# Patient Record
Sex: Female | Born: 1950 | Marital: Married | Smoking: Former smoker
Health system: Southern US, Community
[De-identification: ages and names within clinical notes are randomized; demographics above are authoritative.]

## PROBLEM LIST (undated history)

## (undated) DIAGNOSIS — K219 Gastro-esophageal reflux disease without esophagitis: Secondary | ICD-10-CM

## (undated) DIAGNOSIS — R9430 Abnormal result of cardiovascular function study, unspecified: Secondary | ICD-10-CM

## (undated) DIAGNOSIS — E785 Hyperlipidemia, unspecified: Secondary | ICD-10-CM

## (undated) DIAGNOSIS — K5790 Diverticulosis of intestine, part unspecified, without perforation or abscess without bleeding: Secondary | ICD-10-CM

## (undated) DIAGNOSIS — D649 Anemia, unspecified: Secondary | ICD-10-CM

## (undated) HISTORY — DX: Hyperlipidemia, unspecified: E78.5

## (undated) HISTORY — DX: Abnormal result of cardiovascular function study, unspecified: R94.30

## (undated) HISTORY — PX: APPENDECTOMY: SHX54

## (undated) HISTORY — DX: Anemia, unspecified: D64.9

## (undated) HISTORY — PX: EYE SURGERY: SHX253

## (undated) HISTORY — DX: Diverticulosis of intestine, part unspecified, without perforation or abscess without bleeding: K57.90

## (undated) HISTORY — DX: Gastro-esophageal reflux disease without esophagitis: K21.9

## (undated) HISTORY — PX: TUBAL LIGATION: SHX77

---

## 1999-08-13 ENCOUNTER — Other Ambulatory Visit: Admission: RE | Admit: 1999-08-13 | Discharge: 1999-08-13 | Payer: Self-pay | Admitting: Family Medicine

## 2003-02-10 ENCOUNTER — Other Ambulatory Visit: Admission: RE | Admit: 2003-02-10 | Discharge: 2003-02-10 | Payer: Self-pay | Admitting: Internal Medicine

## 2010-12-03 ENCOUNTER — Ambulatory Visit (HOSPITAL_COMMUNITY)
Admission: RE | Admit: 2010-12-03 | Discharge: 2010-12-03 | Disposition: A | Discharge status: Home / Self Care | Payer: 59 | Source: Ambulatory Visit | Attending: Obstetrics & Gynecology | Admitting: Obstetrics & Gynecology

## 2010-12-03 ENCOUNTER — Other Ambulatory Visit: Payer: Self-pay | Admitting: Obstetrics & Gynecology

## 2010-12-03 DIAGNOSIS — N950 Postmenopausal bleeding: Secondary | ICD-10-CM | POA: Insufficient documentation

## 2010-12-03 LAB — CBC
HCT: 41.3 % (ref 36.0–46.0)
Hemoglobin: 14.7 g/dL (ref 12.0–15.0)
MCH: 32.5 pg (ref 26.0–34.0)
MCHC: 35.6 g/dL (ref 30.0–36.0)
MCV: 91.4 fL (ref 78.0–100.0)
Platelets: 238 10*3/uL (ref 150–400)
RBC: 4.52 MIL/uL (ref 3.87–5.11)
RDW: 12.5 % (ref 11.5–15.5)
WBC: 7.5 10*3/uL (ref 4.0–10.5)

## 2010-12-03 LAB — TYPE AND SCREEN
ABO/RH(D): O POS
Antibody Screen: NEGATIVE

## 2010-12-03 LAB — ABO/RH: ABO/RH(D): O POS

## 2010-12-15 NOTE — Op Note (Signed)
  [name], Yvonne                ACCOUNT NO.:  [id number]  MEDICAL RECORD NO.:  [id number]           PATIENT TYPE:  O  LOCATION:  [facility]                          [facility]  PHYSICIAN:  Genia Del, M.D.DATE OF BIRTH:  [birth year]  DATE OF PROCEDURE:  12/03/2010 DATE OF DISCHARGE:                              OPERATIVE REPORT   PREOPERATIVE DIAGNOSES:  Postmenopausal bleeding and intrauterine lesion.  POSTOPERATIVE DIAGNOSES:  Postmenopausal bleeding and intrauterine lesions x2.  PROCEDURE:  Hysteroscopy resection, dilatation and curettage.  SURGEON:  Genia Del, MD  ANESTHESIOLOGIST:  Brayton Caves, MD  PROCEDURE:  Under general anesthesia with mask ventilation, the patient was in lithotomy position.  She was prepped with Betadine on the suprapubic vulvar and vaginal areas and draped as usual.  The vaginal exam reveals an anteverted uterus normal size.  No adnexal mass.  The speculum was introduced in the vagina.  The anterior lip of the cervix was grasped with a tenaculum.  A paracervical block was done with Nesacaine 1% at 20 mL total at 4 and 8 o'clock.  We then proceeded with dilatation of the cervix with Hegar dilators up to #31 without difficulty.  We used the operative hysteroscope with the Versapoint loop.  We visualized the intrauterine cavity.  Vision was exceptionally good.  We see both ostia and pictures were taken.  We also see a large intrauterine lesion with its base at the lower posterior aspect of the intrauterine cavity.  The lesion was measured by ultrasound at 4.3 cm. The aspect of the lesion was quite vascular and the lesion appears softer, it gives the appearance of a hyperplastic polyp.  We started the resection at the base with the loop, then lesion was completely detached.  Hemostasis was adequate at the base.  We then used a fenestrated clamp to remove the specimen.  We then go back with the hysteroscope and note that a second  intrauterine lesion with the same appearance was present and still attached to the left fundal wall.  We used the Versapoint loop to detach that second lesion from the base. Once it was freed, it was removed with the loop.  We finally go back with the hysteroscope in the intrauterine cavity.  No lesion was present.  Both bases of those intrauterine lesions were hemostatic.  We proceeded with an endometrial curettage with a sharp curette and all intrauterine surfaces that specimen was sent to pathology separately. We then go back confirmed good hemostasis, took pictures of the intrauterine cavity which appears normal.  We removed all instruments. The estimated blood loss was minimal.  Fluid deficit was 230 mL.  No complications occurred and the patient was brought to recovery room in good stable status.    Genia Del, M.D.    ML/MEDQ  D:  12/03/2010  T:  12/04/2010  Job:  [id number]  Electronically Signed by Genia Del M.D. on 12/15/2010 66:44:03 PM

## 2010-12-24 HISTORY — PX: COMBINED HYSTEROSCOPY DIAGNOSTIC / D&C: SUR297

## 2011-09-06 ENCOUNTER — Ambulatory Visit (INDEPENDENT_AMBULATORY_CARE_PROVIDER_SITE_OTHER): Payer: 59 | Admitting: Cardiology

## 2011-09-06 ENCOUNTER — Encounter: Payer: Self-pay | Admitting: *Deleted

## 2011-09-06 ENCOUNTER — Encounter: Payer: Self-pay | Admitting: Cardiology

## 2011-09-06 DIAGNOSIS — K219 Gastro-esophageal reflux disease without esophagitis: Secondary | ICD-10-CM | POA: Insufficient documentation

## 2011-09-06 DIAGNOSIS — R079 Chest pain, unspecified: Secondary | ICD-10-CM | POA: Insufficient documentation

## 2011-09-06 DIAGNOSIS — E785 Hyperlipidemia, unspecified: Secondary | ICD-10-CM | POA: Insufficient documentation

## 2011-09-06 DIAGNOSIS — R9430 Abnormal result of cardiovascular function study, unspecified: Secondary | ICD-10-CM | POA: Insufficient documentation

## 2011-09-06 NOTE — Patient Instructions (Signed)
Your physician has requested that you have en exercise stress myoview. For further information please visit [email or web address] Please follow instruction sheet, as given.  Your physician recommends that you schedule a follow-up appointment in: as needed

## 2011-09-06 NOTE — Assessment & Plan Note (Signed)
The patient has had some reflux symptoms over time.  However this does not appear to explain her recent chest discomfort.

## 2011-09-06 NOTE — Assessment & Plan Note (Signed)
The patient has had some mild chest discomfort.  At this point we do not have definite proof of ischemic pain.  However her treadmill was abnormal and she needs further workup.

## 2011-09-06 NOTE — Progress Notes (Signed)
HPI  The patient is seen in consultation today for a history of chest discomfort and for an abnormal treadmill.  She is quite active.  She has some left sternal chest discomfort.  It is not necessarily related to exertion.  She's had elevated triglycerides.  Her resting EKG reveals minor nonspecific ST-T wave changes.  Her primary physician arranged for a standard treadmill. This was performed by her primary team.  She exercised for 7 minutes.  Her blood pressure one appropriately.  She did not have any more chest pain.  However she had significant downsloping ST depression in leads 2,3, anf  aVF and V4 to V6 In recovery.  She was therefore referred for further evaluation.  As part of today's evaluation I have reviewed the treadmill report.  Also reviewed office note from her primary care team.  Allergies  Allergen Reactions  . Tdap (Adacel)   . Tetanus Antitoxin     Current Outpatient Prescriptions  Medication Sig Dispense Refill  . omeprazole (PRILOSEC) 40 MG capsule Take 40 mg by mouth daily.       Marland Kitchen ESTRACE VAGINAL 0.1 MG/GM vaginal cream Maybe once a week per pt        History   Social History  . Marital Status: Married    Spouse Name: N/A    Number of Children: N/A  . Years of Education: N/A   Occupational History  . Not on file.   Social History Main Topics  . Smoking status: Former Games developer  . Smokeless tobacco: Not on file   Comment: quit over 30 years ago  . Alcohol Use: Yes     1-2 daily  . Drug Use: No  . Sexually Active: Not on file   Other Topics Concern  . Not on file   Social History Narrative  . No narrative on file    Family History  Problem Relation Age of Onset  . Diabetes Sister   . Hypertension Mother     possible  . Nephrolithiasis Mother     Past Medical History  Diagnosis Date  . Hyperlipidemia   . Chest pain   . GERD (gastroesophageal reflux disease)   . Abnormal exercise tolerance test     Past Surgical History  Procedure Date  .  Combined hysteroscopy diagnostic / d&c 12/24/2010    ROS   Patient denies fever, chills, headache, sweats, rash, change in vision, change in hearing, cough, nausea vomiting, urinary symptoms.  All of the systems are reviewed and are negative.  PHYSICAL EXAM  Patient is oriented to person time and place.  Affect is normal.  Head is atraumatic.  There is no jugular venous distention.  Lungs are clear.  Respiratory effort is not labored.  Cardiac exam reveals S1 and S2.  No clicks or significant murmurs.  The abdomen is soft there is no peripheral edema.  There no musculoskeletal deformities.  No skin rashes.  Filed Vitals:   09/06/11 1108  BP: 123/75  Pulse: 66  Height: 5' (1.524 m)  Weight: 142 lb (64.411 kg)    EKG  EKG is done today and reviewed by me.  There is normal sinus rhythm.  There are mild nonspecific ST-T wave changes.  ASSESSMENT & PLAN

## 2011-09-06 NOTE — Assessment & Plan Note (Addendum)
The patient had downsloping ST depression from her standard treadmill.  It is possible that this could have been a false positive.  However she has had chest pain.  We will proceed with a stress nuclear scan or further assessment. If the patient's nuclear scan is normal, we will communicate this to her as she would not need a followup visit.  I explained this to her and she understands.  If there is any abnormality I'll see her for followup

## 2011-09-15 ENCOUNTER — Ambulatory Visit (HOSPITAL_COMMUNITY): Payer: 59 | Attending: Cardiovascular Disease | Admitting: Radiology

## 2011-09-15 DIAGNOSIS — Z87891 Personal history of nicotine dependence: Secondary | ICD-10-CM | POA: Insufficient documentation

## 2011-09-15 DIAGNOSIS — E785 Hyperlipidemia, unspecified: Secondary | ICD-10-CM | POA: Insufficient documentation

## 2011-09-15 DIAGNOSIS — R079 Chest pain, unspecified: Secondary | ICD-10-CM

## 2011-09-15 DIAGNOSIS — R9439 Abnormal result of other cardiovascular function study: Secondary | ICD-10-CM | POA: Insufficient documentation

## 2011-09-15 DIAGNOSIS — R9430 Abnormal result of cardiovascular function study, unspecified: Secondary | ICD-10-CM

## 2011-09-15 MED ORDER — TECHNETIUM TC 99M TETROFOSMIN IV KIT
10.0000 | PACK | Freq: Once | INTRAVENOUS | Status: AC | PRN
  Administered 2011-09-15: 10 via INTRAVENOUS

## 2011-09-15 MED ORDER — TECHNETIUM TC 99M TETROFOSMIN IV KIT
30.0000 | PACK | Freq: Once | INTRAVENOUS | Status: AC | PRN
  Administered 2011-09-15: 30 via INTRAVENOUS

## 2011-09-15 NOTE — Progress Notes (Signed)
[facility] SITE 3 NUCLEAR MED [address] [place] [address] [phone]  Cardiology Nuclear Med Study  Yvonne [name] is a 60 y.o. female [id number] [birth year]   Nuclear Med Background Indication for Stress Test:  Evaluation for Ischemia and Abnormal GXT History: 12/12 [id number] Cardiac Risk Factors: History of Smoking and Lipids  Symptoms:  Chest Pressure.  (last episode of chest discomfort was about one month ago)   Nuclear Pre-Procedure Caffeine/Decaff Intake:  None NPO After: 7:00pm   Lungs:  Clear. IV 0.9% NS with Angio Cath:  22g  IV Site: R Antecubital  IV Started by:  Stanton Kidney, EMT-P  Chest Size (in):  36 Cup Size: B  Height: 5' (1.524 m)  Weight:  142 lb (64.411 kg)  BMI:  Body mass index is 27.73 kg/(m^2). Tech Comments:  NA    Nuclear Med Study 1 or 2 day study: 1 day  Stress Test Type:  Stress  Reading MD: Charlton Haws, MD  Order Authorizing Provider:  Willa Rough, MD  Resting Radionuclide: Technetium 24m Tetrofosmin  Resting Radionuclide Dose: 11.0 mCi   Stress Radionuclide:  Technetium 3m Tetrofosmin  Stress Radionuclide Dose: 33.0 mCi           Stress Protocol Rest HR: 61 Stress HR: 176  Rest BP: Sitting:127/72  Standing:140/75 Stress BP: 164/83  Exercise Time (min): 8:15 METS: 10.1   Predicted Max HR: 159 bpm % Max HR: 110.69 bpm Rate Pressure Product: 25366   Dose of Adenosine (mg):  n/a Dose of Lexiscan: n/a mg  Dose of Atropine (mg): n/a Dose of Dobutamine: n/a mcg/kg/min (at max HR)  Stress Test Technologist: Smiley Houseman, CMA-N  Nuclear Technologist:  Domenic Polite, CNMT     Rest Procedure:  Myocardial perfusion imaging was performed at rest 45 minutes following the intravenous administration of Technetium 36m Tetrofosmin.  Rest ECG: No acute changes.  Stress Procedure:  The patient exercised for 8:15 on the treadmill utilizing the Bruce protocol.  The patient stopped due to fatigue and denied any chest  pain.  There were diagnostic ST-T wave changes and occasional PAC's.  Technetium 88m Tetrofosmin was injected at peak exercise and myocardial perfusion imaging was performed after a brief delay.  Stress ECG: 2mm of ST segment depression in lateral leads with T wave inversions in recovery  QPS Raw Data Images:  Normal; no motion artifact; normal heart/lung ratio. Stress Images:  Normal homogeneous uptake in all areas of the myocardium. Rest Images:  Normal homogeneous uptake in all areas of the myocardium. Subtraction (SDS):  Normal Transient Ischemic Dilatation (Normal <1.22):  0.96 Lung/Heart Ratio (Normal <0.45):  0.21  Quantitative Gated Spect Images QGS EDV:  43 ml QGS ESV:  9 ml QGS cine images:  NL LV Function; NL Wall Motion QGS EF: 80%  Impression Exercise Capacity:  Fair exercise capacity. BP Response:  Normal blood pressure response. Clinical Symptoms:  There is dyspnea. ECG Impression:  Positive Comparison with Prior Nuclear Study: No previous nuclear study performed  Overall Impression:  Low risk stress nuclear study.  Normal scintigraphic images EF 80%  Positive ECG response see above    Charlton Haws

## 2011-09-22 ENCOUNTER — Encounter: Payer: Self-pay | Admitting: Cardiology

## 2011-09-22 NOTE — Progress Notes (Signed)
The patient had been seen in consultation because of an abnormal standard exercise test. Decision was made to do a nuclear stress study. This was done, December, 2012. Once again with exercise she had abnormal EKGs. However the nuclear images are completely normal and the ejection fraction is normal greater than 70%. Therefore results are communicated to the patient and she does not need any followup as outlined in my original consultation note.

## 2011-09-23 ENCOUNTER — Telehealth: Payer: Self-pay | Admitting: Cardiology

## 2011-09-23 NOTE — Telephone Encounter (Signed)
Fu call °Pt returning your call about lab results °

## 2011-09-23 NOTE — Telephone Encounter (Signed)
Results given to pt

## 2012-12-20 ENCOUNTER — Ambulatory Visit (INDEPENDENT_AMBULATORY_CARE_PROVIDER_SITE_OTHER): Payer: 59 | Admitting: Nurse Practitioner

## 2012-12-20 VITALS — BP 128/70 | HR 76 | Temp 97.8°F | Wt 144.0 lb

## 2012-12-20 DIAGNOSIS — A499 Bacterial infection, unspecified: Secondary | ICD-10-CM

## 2012-12-20 DIAGNOSIS — N390 Urinary tract infection, site not specified: Secondary | ICD-10-CM

## 2012-12-20 LAB — POCT URINALYSIS DIPSTICK
Bilirubin, UA: NEGATIVE
Blood, UA: NEGATIVE
Glucose, UA: NEGATIVE
Ketones, UA: NEGATIVE
Nitrite, UA: NEGATIVE
Protein, UA: NEGATIVE
Spec Grav, UA: 1.01
Urobilinogen, UA: NEGATIVE
pH, UA: 7.5

## 2012-12-20 LAB — POCT UA - MICROSCOPIC ONLY
Bacteria, U Microscopic: NEGATIVE
Casts, Ur, LPF, POC: NEGATIVE
Crystals, Ur, HPF, POC: NEGATIVE
Mucus, UA: NEGATIVE
RBC, urine, microscopic: NEGATIVE
Yeast, UA: NEGATIVE

## 2012-12-20 MED ORDER — CIPROFLOXACIN HCL 500 MG PO TABS: 500.0000 mg | ORAL_TABLET | Freq: Two times a day (BID) | ORAL | Status: AC

## 2012-12-20 NOTE — Progress Notes (Signed)
  Subjective:    Patient ID: Yvonne [name], female    DOB: Apr 13, 1951, 62 y.o.   MRN: [id number]  Urinary Tract Infection  This is a new problem. The current episode started yesterday. The problem occurs intermittently. The problem has been gradually worsening. The quality of the pain is described as aching and burning. The pain is at a severity of 8/10. The pain is moderate. There has been no fever. She is sexually active. There is no history of pyelonephritis. Pertinent negatives include no chills or flank pain. She has tried nothing for the symptoms.  Reports burning during urination, denies blood, or foul smelling urine. Also reports voiding more frequently in small amounts.     Review of Systems  Constitutional: Negative for fever, chills and appetite change.  Respiratory: Negative for chest tightness and shortness of breath.   Gastrointestinal: Negative for abdominal pain.  Genitourinary: Positive for dysuria. Negative for flank pain, difficulty urinating and pelvic pain.  Skin: Negative for rash.  Neurological: Negative for dizziness.  Psychiatric/Behavioral: Negative.        Objective:   Physical Exam  Constitutional: She is oriented to person, place, and time. She appears well-developed and well-nourished.  Cardiovascular: Normal rate, regular rhythm and normal heart sounds.   No murmur heard. Pulmonary/Chest: Effort normal and breath sounds normal. No respiratory distress.  Abdominal: Soft. Bowel sounds are normal. She exhibits no distension. There is no tenderness.  Neurological: She is alert and oriented to person, place, and time.  Skin: Skin is warm and dry.    Results for orders placed in visit on 12/20/12  POCT URINALYSIS DIPSTICK      Result Value Range   Color, UA yellow     Clarity, UA clear     Glucose, UA neg     Bilirubin, UA neg     Ketones, UA neg     Spec Grav, UA 1.010     Blood, UA neg     pH, UA 7.5     Protein, UA neg     Urobilinogen, UA  negative     Nitrite, UA neg     Leukocytes, UA moderate (2+)    POCT UA - MICROSCOPIC ONLY      Result Value Range   WBC, Ur, HPF, POC 15-20 with clumps     RBC, urine, microscopic neg     Bacteria, U Microscopic neg     Mucus, UA neg     Epithelial cells, urine per micros occ     Crystals, Ur, HPF, POC neg     Casts, Ur, LPF, POC neg     Yeast, UA neg          Assessment & Plan:  UTI Complete full course antibiotics Increase fluid intake Proper perineal hygiene Proper handwashing Increase fluid intake Frequent voiding RTO prn   Raymon Mutton, FNP-C

## 2012-12-20 NOTE — Patient Instructions (Signed)
Urinary Tract Infection Urinary tract infections (UTIs) can develop anywhere along your urinary tract. Your urinary tract is your body's drainage system for removing wastes and extra water. Your urinary tract includes two kidneys, two ureters, a bladder, and a urethra. Your kidneys are a pair of bean-shaped organs. Each kidney is about the size of your fist. They are located below your ribs, one on each side of your spine. CAUSES Infections are caused by microbes, which are microscopic organisms, including fungi, viruses, and bacteria. These organisms are so small that they can only be seen through a microscope. Bacteria are the microbes that most commonly cause UTIs. SYMPTOMS  Symptoms of UTIs may vary by age and gender of the patient and by the location of the infection. Symptoms in young women typically include a frequent and intense urge to urinate and a painful, burning feeling in the bladder or urethra during urination. Older women and men are more likely to be tired, shaky, and weak and have muscle aches and abdominal pain. A fever may mean the infection is in your kidneys. Other symptoms of a kidney infection include pain in your back or sides below the ribs, nausea, and vomiting. DIAGNOSIS To diagnose a UTI, your caregiver will ask you about your symptoms. Your caregiver also will ask to provide a urine sample. The urine sample will be tested for bacteria and white blood cells. White blood cells are made by your body to help fight infection. TREATMENT  Typically, UTIs can be treated with medication. Because most UTIs are caused by a bacterial infection, they usually can be treated with the use of antibiotics. The choice of antibiotic and length of treatment depend on your symptoms and the type of bacteria causing your infection. HOME CARE INSTRUCTIONS  If you were prescribed antibiotics, take them exactly as your caregiver instructs you. Finish the medication even if you feel better after you  have only taken some of the medication.  Drink enough water and fluids to keep your urine clear or pale yellow.  Avoid caffeine, tea, and carbonated beverages. They tend to irritate your bladder.  Empty your bladder often. Avoid holding urine for long periods of time.  Empty your bladder before and after sexual intercourse.  After a bowel movement, women should cleanse from front to back. Use each tissue only once. SEEK MEDICAL CARE IF:   You have back pain.  You develop a fever.  Your symptoms do not begin to resolve within 3 days. SEEK IMMEDIATE MEDICAL CARE IF:   You have severe back pain or lower abdominal pain.  You develop chills.  You have nausea or vomiting.  You have continued burning or discomfort with urination. MAKE SURE YOU:   Understand these instructions.  Will watch your condition.  Will get help right away if you are not doing well or get worse. Document Released: 06/29/2005 Document Revised: 03/20/2012 Document Reviewed: 10/28/2011 Exit[employer] Patient Information 2013 [employer].  

## 2013-01-21 ENCOUNTER — Telehealth: Payer: Self-pay | Admitting: Nurse Practitioner

## 2013-01-22 NOTE — Telephone Encounter (Signed)
appt made

## 2013-01-23 ENCOUNTER — Ambulatory Visit (INDEPENDENT_AMBULATORY_CARE_PROVIDER_SITE_OTHER): Payer: 59

## 2013-01-23 ENCOUNTER — Ambulatory Visit (INDEPENDENT_AMBULATORY_CARE_PROVIDER_SITE_OTHER): Payer: 59 | Admitting: Family Medicine

## 2013-01-23 ENCOUNTER — Encounter: Payer: Self-pay | Admitting: Family Medicine

## 2013-01-23 VITALS — BP 118/72 | HR 64 | Temp 97.0°F | Ht 60.0 in | Wt 143.0 lb

## 2013-01-23 DIAGNOSIS — J302 Other seasonal allergic rhinitis: Secondary | ICD-10-CM | POA: Insufficient documentation

## 2013-01-23 DIAGNOSIS — R5381 Other malaise: Secondary | ICD-10-CM

## 2013-01-23 DIAGNOSIS — R5383 Other fatigue: Secondary | ICD-10-CM

## 2013-01-23 DIAGNOSIS — R0989 Other specified symptoms and signs involving the circulatory and respiratory systems: Secondary | ICD-10-CM

## 2013-01-23 DIAGNOSIS — R0609 Other forms of dyspnea: Secondary | ICD-10-CM

## 2013-01-23 DIAGNOSIS — J309 Allergic rhinitis, unspecified: Secondary | ICD-10-CM

## 2013-01-23 DIAGNOSIS — R0600 Dyspnea, unspecified: Secondary | ICD-10-CM | POA: Insufficient documentation

## 2013-01-23 LAB — COMPLETE METABOLIC PANEL WITH GFR
ALT: 19 U/L (ref 0–35)
AST: 19 U/L (ref 0–37)
Albumin: 4.2 g/dL (ref 3.5–5.2)
Alkaline Phosphatase: 87 U/L (ref 39–117)
BUN: 14 mg/dL (ref 6–23)
CO2: 28 mEq/L (ref 19–32)
Calcium: 9.2 mg/dL (ref 8.4–10.5)
Chloride: 106 mEq/L (ref 96–112)
Creat: 0.7 mg/dL (ref 0.50–1.10)
GFR, Est African American: >89 mL/min
GFR, Est Non African American: >89 mL/min
Glucose, Bld: 92 mg/dL (ref 70–99)
Potassium: 4.3 mEq/L (ref 3.5–5.3)
Sodium: 141 mEq/L (ref 135–145)
Total Bilirubin: 0.7 mg/dL (ref 0.3–1.2)
Total Protein: 6.9 g/dL (ref 6.0–8.3)

## 2013-01-23 LAB — POCT CBC
Granulocyte percent: 54.6 %G (ref 37–80)
HCT, POC: 39.2 % (ref 37.7–47.9)
Hemoglobin: 13.5 g/dL (ref 12.2–16.2)
Lymph, poc: 2.3 (ref 0.6–3.4)
MCH, POC: 31.7 pg — AB (ref 27–31.2)
MCHC: 34.5 g/dL (ref 31.8–35.4)
MCV: 92 fL (ref 80–97)
MPV: 7.9 fL (ref 0–99.8)
POC Granulocyte: 3.1 (ref 2–6.9)
POC LYMPH PERCENT: 41.9 %L (ref 10–50)
Platelet Count, POC: 213 10*3/uL (ref 142–424)
RBC: 4.3 M/uL (ref 4.04–5.48)
RDW, POC: 13.1 %
WBC: 5.6 10*3/uL (ref 4.6–10.2)

## 2013-01-23 LAB — FOLATE: Folate: 14.8 ng/mL

## 2013-01-23 LAB — TSH: TSH: 2.525 u[IU]/mL (ref 0.350–4.500)

## 2013-01-23 LAB — VITAMIN B12: Vitamin B-12: 233 pg/mL (ref 211–911)

## 2013-01-23 MED ORDER — FLUTICASONE PROPIONATE 50 MCG/ACT NA SUSP: 2.0000 | Freq: Every day | NASAL | Status: DC

## 2013-01-23 MED ORDER — DESLORATADINE 5 MG PO TABS: 5.0000 mg | ORAL_TABLET | Freq: Every day | ORAL | Status: DC

## 2013-01-23 NOTE — Progress Notes (Signed)
Patient ID: Yvonne [name], female   DOB: Apr 06, 1951, 62 y.o.   MRN: [id number] SUBJECTIVE: HPI: Fatigue Patient complains of fatigue. Symptoms began about a month ago. The patient feels the fatigue began with: exercise intolerance. Symptoms of her fatigue have been general malaise. Patient describes the following psychological symptoms: none. Patient denies change in hair texture, cold intolerance, constipation, excessive menstrual bleeding, fever, GI blood loss, significant change in weight, symptoms of arthritis, unusual rashes and witnessed or suspected sleep apnea. Symptoms have been intermittent. Symptom severity: mild. Previous visits for this problem: none.   PMH/PSH: reviewed/updated in Epic  SH/FH: reviewed/updated in Epic  Allergies: reviewed/updated in Epic  Medications: reviewed/updated in Epic  Immunizations: reviewed/updated in Epic  ROS: As above in the HPI. All other systems are stable or negative.  OBJECTIVE: APPEARANCE: short stature Hispanic( [place] heritage) Patient in no acute distress.The patient appeared well nourished and normally developed. Acyanotic. Waist: VITAL SIGNS:BP 118/72  Pulse 64  Temp(Src) 97 F (36.1 C) (Oral)  Ht 5' (1.524 m)  Wt 143 lb (64.864 kg)  BMI 27.93 kg/m2  SpO2 98%   SKIN: warm and  Dry without overt rashes, tattoos and scars  HEAD and Neck: without JVD, Head and scalp: normal Eyes:No scleral icterus. Fundi normal, eye movements normal. Ears: Auricle normal, canal normal, Tympanic membranes normal, insufflation normal. Nose: normal Throat: normal Neck & thyroid: normal  CHEST & LUNGS: Chest wall: normal Lungs: Clear  CVS: Reveals the PMI to be normally located. Regular rhythm, First and Second Heart sounds are normal,  absence of murmurs, rubs or gallops. Peripheral vasculature: Radial pulses: normal Dorsal pedis pulses: normal Posterior pulses: normal  ABDOMEN:  Appearance: normal Benign,, no organomegaly, no  masses, no Abdominal Aortic enlargement. No Guarding , no rebound. No Bruits. Bowel sounds: normal  RECTAL: N/A GU: N/A  EXTREMETIES: nonedematous. Both Femoral and Pedal pulses are normal.  MUSCULOSKELETAL:  Spine: normal Joints: intact  NEUROLOGIC: oriented to time,place and person; nonfocal. Strength is normal Sensory is normal Reflexes are normal Cranial Nerves are normal.  ASSESSMENT: Other malaise and fatigue - Plan: POCT CBC, COMPLETE METABOLIC PANEL WITH GFR, TSH, Vitamin D 25 hydroxy, Vitamin B12, Folate, DG Chest 2 View, Ambulatory referral to Cardiology, CANCELED: POCT urinalysis dipstick  Dyspnea - Plan: POCT CBC, COMPLETE METABOLIC PANEL WITH GFR, DG Chest 2 View, EKG 12-Lead, Ambulatory referral to Cardiology  Seasonal allergic rhinitis - Plan: desloratadine (CLARINEX) 5 MG tablet, fluticasone (FLONASE) 50 MCG/ACT nasal spray  PLAN: Orders Placed This Encounter  Procedures  . DG Chest 2 View    Standing Status: Future     Number of Occurrences: 1     Standing Expiration Date: 03/25/2014    Order Specific Question:  Reason for Exam (SYMPTOM  OR DIAGNOSIS REQUIRED)    Answer:  dyspnea    Order Specific Question:  Reason for Exam (SYMPTOM  OR DIAGNOSIS REQUIRED)    Answer:  patient from [place]    Order Specific Question:  Preferred imaging location?    Answer:  Internal  . COMPLETE METABOLIC PANEL WITH GFR  . TSH  . Vitamin D 25 hydroxy  . Vitamin B12  . Folate  . Ambulatory referral to Cardiology    Referral Priority:  Routine    Referral Type:  Consultation    Referral Reason:  Specialty Services Required    Requested Specialty:  Cardiology    Number of Visits Requested:  1  . POCT CBC  . EKG 12-Lead  Results for orders placed in visit on 01/23/13 (from the past 24 hour(s))  POCT CBC     Status: Abnormal   Collection Time    01/23/13 10:45 AM      Result Value Range   WBC 5.6  4.6 - 10.2 K/uL   Lymph, poc 2.3  0.6 - 3.4   POC LYMPH PERCENT  41.9  10 - 50 %L   POC Granulocyte 3.1  2 - 6.9   Granulocyte percent 54.6  37 - 80 %G   RBC 4.3  4.04 - 5.48 M/uL   Hemoglobin 13.5  12.2 - 16.2 g/dL   HCT, POC 56.2  13.0 - 47.9 %   MCV 92.0  80 - 97 fL   MCH, POC 31.7 (*) 27 - 31.2 pg   MCHC 34.5  31.8 - 35.4 g/dL   RDW, POC 86.5     Platelet Count, POC 213.0  142 - 424 K/uL   MPV 7.9  0 - 99.8 fL   Meds ordered this encounter  Medications  . desloratadine (CLARINEX) 5 MG tablet    Sig: Take 1 tablet (5 mg total) by mouth daily.    Dispense:  30 tablet    Refill:  5  . fluticasone (FLONASE) 50 MCG/ACT nasal spray    Sig: Place 2 sprays into the nose daily.    Dispense:  16 g    Refill:  5  [facility] reading (PRIMARY) by  Dr. Leodis Sias: No acute findings.                           await the lab results. Await the  Evaluation by her Cardiologist.Referral made. Patient has her PEX scheduled in July.  Phelicia Dantes P. Modesto Charon, M.D.  .

## 2013-01-23 NOTE — Patient Instructions (Addendum)
Rinitis Alrgica (Allergic Rhinitis) La rinitis alrgica aparece cuando las membranas mucosas de la nariz reaccionan a los alrgenos. Los alrgenos son las partculas que estn en el aire y a las que el organismo responde cuando existe una Automotive engineer. Esto hace que usted libere anticuerpos de Programmer, multimedia. A travs de una sucesin de procesos, finalmente se libera histamina (de ah el uso de antihistamnicos) en el torrente sanguneo. Aunque esto implica una proteccin para su organismo, es lo que le produce las Thorp., como estornudos frecuentes, congestin, picazn y goteos de Architectural technologist.  CAUSAS Los alergenos del polen pueden provenir del csped, rboles y hierbas. Esto produce la rinitis alrgica estacional, o "fiebre de heno". Otras alrgenos pueden ocasionar rinitis alrgica persistente (rinitis alrgica perenne) como aquellos que contienen los caros del polvo del hogar, el pelaje de las mascotas y las esporas del moho.  SNTOMAS  Congestin nasal.  Picazn y goteo de la nariz con estornudos y lagrimeo de los ojos.  Generalmente, tambin puede haber picazn de la boca, ojos y odos. Las alergias no pueden curarse pero pueden controlarse con medicamentos. DIAGNSTICO Si no reconoce exactamente cul es el alrgeno que le ocasiona el problema, podrn realizarle pruebas de [place], o de piel para determinarlo. TRATAMIENTO  Evite el alrgeno.  Podrn ser tiles medicamentos y vacunas para la alergia (inmunoterapia).  Con frecuencia la fiebre de heno se trata simplemente con antihistamnicos en forma de pldoras o sprays nasales. Los antihistamnicos bloquean los efectos de la histamina. Existen medicamentos de venta libre que lo ayudarn a Associate Professor, la congestin nasal y la hinchazn alrededor de los ojos. Consulte con el profesional antes de tomar o Civil Service fast streamer. Si estos medicamentos no le Merchant navy officer, existen muchos otros nuevos que el profesional que  lo asiste puede prescribirle. Si las medidas iniciales no son efectivas, podrn utilizarse medicamentos ms fuertes. Las inyecciones desensibilizantes pueden utilizarse si los otros medicamentos fracasan. La desensibilizacin aparece cuando un paciente recibe inyecciones continuas hasta que el cuerpo se vuelve menos sensible al alrgeno. Asegrese de Education officer, environmental un seguimiento con el profesional que lo asiste si los problemas continan. SOLICITE ANTENCIN MDICA SI:   Le sube la temperatura a ms de 100.5 F (38.1 C).  Presenta tos que no se alivia (persistente).  Le falta el aire.  Comienza a respirar con dificultad.  Los sntomas interfieren con las actividades diarias. Document Released: 06/29/2005 Document Revised: 12/12/2011 [facility] Patient Information 2013 [place], [place].   Allergic Rhinitis Allergic rhinitis is when the mucous membranes in the nose respond to allergens. Allergens are particles in the air that cause your body to have an allergic reaction. This causes you to release allergic antibodies. Through a chain of events, these eventually cause you to release histamine into the blood stream (hence the use of antihistamines). Although meant to be protective to the body, it is this release that causes your discomfort, such as frequent sneezing, congestion and an itchy runny nose.  CAUSES  The pollen allergens may come from grasses, trees, and weeds. This is seasonal allergic rhinitis, or "hay fever." Other allergens cause year-round allergic rhinitis (perennial allergic rhinitis) such as house dust mite allergen, pet dander and mold spores.  SYMPTOMS   Nasal stuffiness (congestion).  Runny, itchy nose with sneezing and tearing of the eyes.  There is often an itching of the mouth, eyes and ears. It cannot be cured, but it can be controlled with medications. DIAGNOSIS  If you are unable to determine the offending allergen,  skin or blood testing may find it. TREATMENT   Avoid  the allergen.  Medications and allergy shots (immunotherapy) can help.  Hay fever may often be treated with antihistamines in pill or nasal spray forms. Antihistamines block the effects of histamine. There are over-the-counter medicines that may help with nasal congestion and swelling around the eyes. Check with your caregiver before taking or giving this medicine. If the treatment above does not work, there are many new medications your caregiver can prescribe. Stronger medications may be used if initial measures are ineffective. Desensitizing injections can be used if medications and avoidance fails. Desensitization is when a patient is given ongoing shots until the body becomes less sensitive to the allergen. Make sure you follow up with your caregiver if problems continue. SEEK MEDICAL CARE IF:   You develop fever (more than 100.5 F (38.1 C).  You develop a cough that does not stop easily (persistent).  You have shortness of breath.  You start wheezing.  Symptoms interfere with normal daily activities. Document Released: 06/14/2001 Document Revised: 12/12/2011 Document Reviewed: 12/24/2008 [facility] Patient Information 2013 [place], [place].

## 2013-01-24 LAB — VITAMIN D 25 HYDROXY (VIT D DEFICIENCY, FRACTURES): Vit D, 25-Hydroxy: 18 ng/mL — ABNORMAL LOW (ref 30–89)

## 2013-01-24 NOTE — Progress Notes (Signed)
Quick Note:  Labs abnormal. Vitamin D is too low. Will need to start on Vit D 4000 Iu daily. And repeat the level in 2+ months. The Rest is normal. ______

## 2013-01-25 ENCOUNTER — Other Ambulatory Visit: Payer: 59

## 2013-01-25 NOTE — Addendum Note (Signed)
Addended by: Roselyn Reef on: 01/25/2013 02:31 PM   Modules accepted: Orders

## 2013-02-15 ENCOUNTER — Ambulatory Visit (INDEPENDENT_AMBULATORY_CARE_PROVIDER_SITE_OTHER): Payer: 59 | Admitting: Family Medicine

## 2013-02-15 ENCOUNTER — Encounter: Payer: Self-pay | Admitting: Family Medicine

## 2013-02-15 VITALS — BP 110/77 | HR 63 | Temp 98.5°F | Ht 60.25 in | Wt 146.0 lb

## 2013-02-15 DIAGNOSIS — R21 Rash and other nonspecific skin eruption: Secondary | ICD-10-CM

## 2013-02-15 DIAGNOSIS — L237 Allergic contact dermatitis due to plants, except food: Secondary | ICD-10-CM

## 2013-02-15 DIAGNOSIS — L255 Unspecified contact dermatitis due to plants, except food: Secondary | ICD-10-CM

## 2013-02-15 MED ORDER — METHYLPREDNISOLONE SODIUM SUCC 125 MG IJ SOLR
125.0000 mg | Freq: Once | INTRAMUSCULAR | Status: AC
  Administered 2013-02-15: 125 mg via INTRAMUSCULAR

## 2013-02-15 MED ORDER — SODIUM CHLORIDE 0.9 % IV SOLN: 125.0000 mg | Freq: Once | INTRAVENOUS | Status: DC

## 2013-02-15 MED ORDER — PREDNISONE 50 MG PO TABS: ORAL_TABLET | ORAL | Status: DC

## 2013-02-15 NOTE — Progress Notes (Signed)
  Subjective:    Patient ID: Yvonne [name], female    DOB: 1951/07/04, 62 y.o.   MRN: [id number]  HPI HPI  This patient complains of a RASH  Location: R arm, R leg, back   Onset: 1 week   Course: Was working in yard last week. Has known poison ivy/poison oak exposure   Self-treated with: calamine lotin   Improvement with treatment: minimal   History  Itching: yes  Tenderness: no  New medications/antibiotics: no  Pet exposure: no  Recent travel or tropical exposure: no  New soaps, shampoos, detergent, clothing: no  Tick/insect exposure: no  Chemical Exposure: no  Red Flags  Feeling ill: no  Fever: no  Facial/tongue swelling/difficulty breathing: no  Diabetic or immunocompromised: no      Review of Systems  All other systems reviewed and are negative.       Objective:   Physical Exam  Constitutional: She appears well-developed and well-nourished.  HENT:  Head: Normocephalic and atraumatic.  Eyes: Conjunctivae are normal. Pupils are equal, round, and reactive to light.  Neck: Normal range of motion.  Cardiovascular: Normal rate and regular rhythm.   Pulmonary/Chest: Effort normal.  Abdominal: Soft.  Musculoskeletal: Normal range of motion.  Neurological: She is alert.  Skin: Rash noted.     Multiple focal erythmatous patches  nontender            Assessment & Plan:  Poison ivy: Solumedrol 25mg  IM x1 Prednisone x 7 days.  Discussed general care and derm red flags  Follow up as needed.      The patient and/or caregiver has been counseled thoroughly with regard to treatment plan and/or medications prescribed including dosage, schedule, interactions, rationale for use, and possible side effects and they verbalize understanding. Diagnoses and expected course of recovery discussed and will return if not improved as expected or if the condition worsens. Patient and/or caregiver verbalized understanding.

## 2013-02-15 NOTE — Addendum Note (Signed)
Addended by: Magdalene River on: 02/15/2013 12:40 PM   Modules accepted: Orders

## 2013-02-20 ENCOUNTER — Telehealth: Payer: Self-pay | Admitting: Family Medicine

## 2013-02-20 ENCOUNTER — Encounter: Payer: Self-pay | Admitting: Family Medicine

## 2013-02-20 ENCOUNTER — Ambulatory Visit (INDEPENDENT_AMBULATORY_CARE_PROVIDER_SITE_OTHER): Payer: 59 | Admitting: Family Medicine

## 2013-02-20 VITALS — BP 107/69 | HR 74 | Temp 98.8°F | Ht 60.25 in | Wt 143.0 lb

## 2013-02-20 DIAGNOSIS — R300 Dysuria: Secondary | ICD-10-CM

## 2013-02-20 DIAGNOSIS — L255 Unspecified contact dermatitis due to plants, except food: Secondary | ICD-10-CM

## 2013-02-20 DIAGNOSIS — L237 Allergic contact dermatitis due to plants, except food: Secondary | ICD-10-CM

## 2013-02-20 LAB — POCT UA - MICROSCOPIC ONLY
Casts, Ur, LPF, POC: NEGATIVE
Crystals, Ur, HPF, POC: NEGATIVE
Yeast, UA: NEGATIVE

## 2013-02-20 LAB — POCT URINALYSIS DIPSTICK
Bilirubin, UA: NEGATIVE
Glucose, UA: NEGATIVE
Ketones, UA: NEGATIVE
Nitrite, UA: POSITIVE
Spec Grav, UA: 1.02
Urobilinogen, UA: NEGATIVE
pH, UA: 5

## 2013-02-20 MED ORDER — METHYLPREDNISOLONE ACETATE 40 MG/ML IJ SUSP: 80.0000 mg | Freq: Once | INTRAMUSCULAR | Status: DC

## 2013-02-20 MED ORDER — FLUCONAZOLE 150 MG PO TABS: 150.0000 mg | ORAL_TABLET | Freq: Once | ORAL | Status: DC

## 2013-02-20 MED ORDER — CEPHALEXIN 500 MG PO CAPS: 500.0000 mg | ORAL_CAPSULE | Freq: Three times a day (TID) | ORAL | Status: DC

## 2013-02-20 MED ORDER — HYDROXYZINE HCL 25 MG PO TABS: 25.0000 mg | ORAL_TABLET | Freq: Three times a day (TID) | ORAL | Status: DC | PRN

## 2013-02-20 MED ORDER — METHYLPREDNISOLONE ACETATE 80 MG/ML IJ SUSP
40.0000 mg | Freq: Once | INTRAMUSCULAR | Status: AC
  Administered 2013-02-20: 40 mg via INTRAMUSCULAR

## 2013-02-20 NOTE — Telephone Encounter (Signed)
appt given  

## 2013-02-20 NOTE — Patient Instructions (Addendum)
Methylprednisolone Suspension for Injection What is this medicine? METHYLPREDNISOLONE (meth ill pred NISS oh lone) is a corticosteroid. It is commonly used to treat inflammation of the skin, joints, lungs, and other organs. Common conditions treated include asthma, allergies, and arthritis. It is also used for other conditions, such as blood disorders and diseases of the adrenal glands. This medicine may be used for other purposes; ask your health care provider or pharmacist if you have questions. What should I tell my health care provider before I take this medicine? They need to know if you have any of these conditions: -cataracts or glaucoma -Cushings -heart disease -high blood pressure -infection including tuberculosis -low calcium or potassium levels in the blood -recent surgery -seizures -stomach or intestinal disease, including colitis -threadworms -thyroid problems -an unusual or allergic reaction to methylprednisolone, corticosteroids, benzyl alcohol, other medicines, foods, dyes, or preservatives -pregnant or trying to get pregnant -breast-feeding How should I use this medicine? This medicine is for injection into a muscle, joint, or other tissue. It is given by a health care professional in a hospital or clinic setting. Talk to your pediatrician regarding the use of this medicine in children. While this drug may be prescribed for selected conditions, precautions do apply. Overdosage: If you think you have taken too much of this medicine contact a poison control center or emergency room at once. NOTE: This medicine is only for you. Do not share this medicine with others. What if I miss a dose? This does not apply. What may interact with this medicine? Do not take this medicine with any of the following medications: -mifepristone -radiopaque contrast agents This medicine may also interact with the following medications: -aspirin and aspirin-like  medicines -cyclosporin -ketoconazole -phenobarbital -phenytoin -rifampin -tacrolimus -troleandomycin -vaccines -warfarin This list may not describe all possible interactions. Give your health care provider a list of all the medicines, herbs, non-prescription drugs, or dietary supplements you use. Also tell them if you smoke, drink alcohol, or use illegal drugs. Some items may interact with your medicine. What should I watch for while using this medicine? Visit your doctor or health care professional for regular checks on your progress. If you are taking this medicine for a long time, carry an identification card with your name and address, the type and dose of your medicine, and your doctor's name and address. The medicine may increase your risk of getting an infection. Stay away from people who are sick. Tell your doctor or health care professional if you are around anyone with measles or chickenpox. You may need to avoid some vaccines. Talk to your health care provider for more information. If you are going to have surgery, tell your doctor or health care professional that you have taken this medicine within the last twelve months. Ask your doctor or health care professional about your diet. You may need to lower the amount of salt you eat. The medicine can increase your blood sugar. If you are a diabetic check with your doctor if you need help adjusting the dose of your diabetic medicine. What side effects may I notice from receiving this medicine? Side effects that you should report to your doctor or health care professional as soon as possible: -allergic reactions like skin rash, itching or hives, swelling of the face, lips, or tongue -bloody or tarry stools -changes in vision -eye pain or bulging eyes -fever, sore throat, sneezing, cough, or other signs of infection, wounds that will not heal -increased thirst -irregular heartbeat -muscle cramps -pain   in hips, back, ribs, arms,  shoulders, or legs -swelling of the ankles, feet, hands -trouble passing urine or change in the amount of urine -unusual bleeding or bruising -unusually weak or tired -weight gain or weight loss Side effects that usually do not require medical attention (report to your doctor or health care professional if they continue or are bothersome): -changes in emotions or moods -constipation or diarrhea -headache -irritation at site where injected -nausea, vomiting -skin problems, acne, thin and shiny skin -trouble sleeping -unusual hair growth on the face or body This list may not describe all possible side effects. Call your doctor for medical advice about side effects. You may report side effects to FDA at 1-800-FDA-1088. Where should I keep my medicine? This drug is given in a hospital or clinic and will not be stored at home. NOTE: This sheet is a summary. It may not cover all possible information. If you have questions about this medicine, talk to your doctor, pharmacist, or health care provider.  2013, [employer]vier/[employer]. (04/09/2008 2:36:31 PM)  

## 2013-02-20 NOTE — Progress Notes (Signed)
  Subjective:    Patient ID: Yvonne [name], female    DOB: Aug 11, 1951, 62 y.o.   MRN: [id number]  HPI DYSURIA Onset:  2-3 days  Description: increased urinary frequency and urgency  Modifying factors: Was seen last week for poison ivy. Has not been able to tolerate oral prednisone as this bothers her stomach mildly. Stopped taking. Unsure if this may have caused urinary issues. No prior hx/o DM.   Symptoms Urgency:  yes Frequency: yes  Hesitancy:  yes Hematuria:  no Flank Pain:  no Fever: no Nausea/Vomiting:  no Missed LMP: no STD exposure: no Discharge: no Irritants: no Rash: no  Red Flags   More than 3 UTI's last 12 months:  no PMH of  Diabetes or Immunosuppression:  no Renal Disease/Calculi: no Urinary Tract Abnormality:  no Instrumentation or Trauma: no  Also with worsening poison ivy rash s/p prednisone d/c.       Review of Systems  All other systems reviewed and are negative.       Objective:   Physical Exam  Constitutional: She appears well-developed and well-nourished.  HENT:  Head: Normocephalic and atraumatic.  Eyes: Conjunctivae are normal. Pupils are equal, round, and reactive to light.  Neck: Normal range of motion.  Cardiovascular: Normal rate and regular rhythm.   Pulmonary/Chest: Effort normal.  Abdominal: Soft. Bowel sounds are normal.  No flank pain  + suprapubic pressure/discomfort    Musculoskeletal: Normal range of motion.  Neurological: She is alert.  Skin: Skin is warm.     Faint erythematous patches            Assessment & Plan:  UTI:  Will treat with keflex Urine culture Add on diflucan given increased risk with abx and steroid use   Poison Ivy:  Will give depomedrol 80mg   IM x 1  Take PPI daily  Avoid oral prednisone.  Atarax for itching.     The patient and/or caregiver has been counseled thoroughly with regard to treatment plan and/or medications prescribed including dosage, schedule, interactions,  rationale for use, and possible side effects and they verbalize understanding. Diagnoses and expected course of recovery discussed and will return if not improved as expected or if the condition worsens. Patient and/or caregiver verbalized understanding.

## 2013-03-11 ENCOUNTER — Ambulatory Visit (INDEPENDENT_AMBULATORY_CARE_PROVIDER_SITE_OTHER): Payer: 59 | Admitting: General Practice

## 2013-03-11 VITALS — BP 120/60 | HR 78 | Temp 99.7°F | Ht 60.0 in | Wt 142.0 lb

## 2013-03-11 DIAGNOSIS — J029 Acute pharyngitis, unspecified: Secondary | ICD-10-CM

## 2013-03-11 DIAGNOSIS — J069 Acute upper respiratory infection, unspecified: Secondary | ICD-10-CM

## 2013-03-11 DIAGNOSIS — J322 Chronic ethmoidal sinusitis: Secondary | ICD-10-CM

## 2013-03-11 LAB — POCT RAPID STREP A (OFFICE): Rapid Strep A Screen: NEGATIVE

## 2013-03-11 MED ORDER — BENZONATATE 100 MG PO CAPS
100.0000 mg | ORAL_CAPSULE | Freq: Two times a day (BID) | ORAL | Status: DC | PRN
Start: 2013-03-11 — End: 2013-04-29

## 2013-03-11 MED ORDER — AMOXICILLIN 500 MG PO CAPS: 500.0000 mg | ORAL_CAPSULE | Freq: Two times a day (BID) | ORAL | Status: DC

## 2013-03-11 NOTE — Patient Instructions (Signed)
Sinusitis  Sinusitis is redness, soreness, and swelling (inflammation) of the paranasal sinuses. Paranasal sinuses are air pockets within the bones of your face (beneath the eyes, the middle of the forehead, or above the eyes). In healthy paranasal sinuses, mucus is able to drain out, and air is able to circulate through them by way of your nose. However, when your paranasal sinuses are inflamed, mucus and air can become trapped. This can allow bacteria and other germs to grow and cause infection.  Sinusitis can develop quickly and last only a short time (acute) or continue over a long period (chronic). Sinusitis that lasts for more than 12 weeks is considered chronic.   CAUSES   Causes of sinusitis include:  · Allergies.  · Structural abnormalities, such as displacement of the cartilage that separates your nostrils (deviated septum), which can decrease the air flow through your nose and sinuses and affect sinus drainage.  · Functional abnormalities, such as when the small hairs (cilia) that line your sinuses and help remove mucus do not work properly or are not present.  SYMPTOMS   Symptoms of acute and chronic sinusitis are the same. The primary symptoms are pain and pressure around the affected sinuses. Other symptoms include:  · Upper toothache.  · Earache.  · Headache.  · Bad breath.  · Decreased sense of smell and taste.  · A cough, which worsens when you are lying flat.  · Fatigue.  · Fever.  · Thick drainage from your nose, which often is green and may contain pus (purulent).  · Swelling and warmth over the affected sinuses.  DIAGNOSIS   Your caregiver will perform a physical exam. During the exam, your caregiver may:  · Look in your nose for signs of abnormal growths in your nostrils (nasal polyps).  · Tap over the affected sinus to check for signs of infection.  · View the inside of your sinuses (endoscopy) with a special imaging device with a light attached (endoscope), which is inserted into your  sinuses.  If your caregiver suspects that you have chronic sinusitis, one or more of the following tests may be recommended:  · Allergy tests.  · Nasal culture A sample of mucus is taken from your nose and sent to a lab and screened for bacteria.  · Nasal cytology A sample of mucus is taken from your nose and examined by your caregiver to determine if your sinusitis is related to an allergy.  TREATMENT   Most cases of acute sinusitis are related to a viral infection and will resolve on their own within 10 days. Sometimes medicines are prescribed to help relieve symptoms (pain medicine, decongestants, nasal steroid sprays, or saline sprays).   However, for sinusitis related to a bacterial infection, your caregiver will prescribe antibiotic medicines. These are medicines that will help kill the bacteria causing the infection.   Rarely, sinusitis is caused by a fungal infection. In theses cases, your caregiver will prescribe antifungal medicine.  For some cases of chronic sinusitis, surgery is needed. Generally, these are cases in which sinusitis recurs more than 3 times per year, despite other treatments.  HOME CARE INSTRUCTIONS   · Drink plenty of water. Water helps thin the mucus so your sinuses can drain more easily.  · Use a humidifier.  · Inhale steam 3 to 4 times a day (for example, sit in the bathroom with the shower running).  · Apply a warm, moist washcloth to your face 3 to 4 times a day,   or as directed by your caregiver.  · Use saline nasal sprays to help moisten and clean your sinuses.  · Take over-the-counter or prescription medicines for pain, discomfort, or fever only as directed by your caregiver.  SEEK IMMEDIATE MEDICAL CARE IF:  · You have increasing pain or severe headaches.  · You have nausea, vomiting, or drowsiness.  · You have swelling around your face.  · You have vision problems.  · You have a stiff neck.  · You have difficulty breathing.  MAKE SURE YOU:   · Understand these  instructions.  · Will watch your condition.  · Will get help right away if you are not doing well or get worse.  Document Released: 09/19/2005 Document Revised: 12/12/2011 Document Reviewed: 10/04/2011  ExitCare® Patient Information ©2014 [employer].  Upper Respiratory Infection, Adult  An upper respiratory infection (URI) is also sometimes known as the common cold. The upper respiratory tract includes the nose, sinuses, throat, trachea, and bronchi. Bronchi are the airways leading to the lungs. Most people improve within 1 week, but symptoms can last up to 2 weeks. A residual cough may last even longer.   CAUSES  Many different viruses can infect the tissues lining the upper respiratory tract. The tissues become irritated and inflamed and often become very moist. Mucus production is also common. A cold is contagious. You can easily spread the virus to others by oral contact. This includes kissing, sharing a glass, coughing, or sneezing. Touching your mouth or nose and then touching a surface, which is then touched by another person, can also spread the virus.  SYMPTOMS   Symptoms typically develop 1 to 3 days after you come in contact with a cold virus. Symptoms vary from person to person. They may include:  · Runny nose.  · Sneezing.  · Nasal congestion.  · Sinus irritation.  · Sore throat.  · Loss of voice (laryngitis).  · Cough.  · Fatigue.  · Muscle aches.  · Loss of appetite.  · Headache.  · Low-grade fever.  DIAGNOSIS   You might diagnose your own cold based on familiar symptoms, since most people get a cold 2 to 3 times a year. Your caregiver can confirm this based on your exam. Most importantly, your caregiver can check that your symptoms are not due to another disease such as strep throat, sinusitis, pneumonia, asthma, or epiglottitis. Blood tests, throat tests, and X-rays are not necessary to diagnose a common cold, but they may sometimes be helpful in excluding other more serious diseases. Your  caregiver will decide if any further tests are required.  RISKS AND COMPLICATIONS   You may be at risk for a more severe case of the common cold if you smoke cigarettes, have chronic heart disease (such as heart failure) or lung disease (such as asthma), or if you have a weakened immune system. The very young and very old are also at risk for more serious infections. Bacterial sinusitis, middle ear infections, and bacterial pneumonia can complicate the common cold. The common cold can worsen asthma and chronic obstructive pulmonary disease (COPD). Sometimes, these complications can require emergency medical care and may be life-threatening.  PREVENTION   The best way to protect against getting a cold is to practice good hygiene. Avoid oral or hand contact with people with cold symptoms. Wash your hands often if contact occurs. There is no clear evidence that vitamin C, vitamin E, echinacea, or exercise reduces the chance of developing a cold. However,   it is always recommended to get plenty of rest and practice good nutrition.  TREATMENT   Treatment is directed at relieving symptoms. There is no cure. Antibiotics are not effective, because the infection is caused by a virus, not by bacteria. Treatment may include:  · Increased fluid intake. Sports drinks offer valuable electrolytes, sugars, and fluids.  · Breathing heated mist or steam (vaporizer or shower).  · Eating chicken soup or other clear broths, and maintaining good nutrition.  · Getting plenty of rest.  · Using gargles or lozenges for comfort.  · Controlling fevers with ibuprofen or acetaminophen as directed by your caregiver.  · Increasing usage of your inhaler if you have asthma.  Zinc gel and zinc lozenges, taken in the first 24 hours of the common cold, can shorten the duration and lessen the severity of symptoms. Pain medicines may help with fever, muscle aches, and throat pain. A variety of non-prescription medicines are available to treat congestion  and runny nose. Your caregiver can make recommendations and may suggest nasal or lung inhalers for other symptoms.   HOME CARE INSTRUCTIONS   · Only take over-the-counter or prescription medicines for pain, discomfort, or fever as directed by your caregiver.  · Use a warm mist humidifier or inhale steam from a shower to increase air moisture. This may keep secretions moist and make it easier to breathe.  · Drink enough water and fluids to keep your urine clear or pale yellow.  · Rest as needed.  · Return to work when your temperature has returned to normal or as your caregiver advises. You may need to stay home longer to avoid infecting others. You can also use a face mask and careful hand washing to prevent spread of the virus.  SEEK MEDICAL CARE IF:   · After the first few days, you feel you are getting worse rather than better.  · You need your caregiver's advice about medicines to control symptoms.  · You develop chills, worsening shortness of breath, or brown or red sputum. These may be signs of pneumonia.  · You develop yellow or brown nasal discharge or pain in the face, especially when you bend forward. These may be signs of sinusitis.  · You develop a fever, swollen neck glands, pain with swallowing, or white areas in the back of your throat. These may be signs of strep throat.  SEEK IMMEDIATE MEDICAL CARE IF:   · You have a fever.  · You develop severe or persistent headache, ear pain, sinus pain, or chest pain.  · You develop wheezing, a prolonged cough, cough up blood, or have a change in your usual mucus (if you have chronic lung disease).  · You develop sore muscles or a stiff neck.  Document Released: 03/15/2001 Document Revised: 12/12/2011 Document Reviewed: 01/21/2011  ExitCare® Patient Information ©2014 [employer].

## 2013-03-11 NOTE — Progress Notes (Signed)
  Subjective:    Patient ID: [name], female    DOB: 09/21/1951, 62 y.o.   MRN: [id number]  HPI Presents today complaining of sore throat, coughing, sneezing, sinus pressure, and ear pain (both). Reports onset was Thursday night. OTC day quil taken this am. Yesterday reports taking tylenol for fever (101), with relief.     Review of Systems  Constitutional: Positive for fever. Negative for chills.  HENT: Positive for ear pain, sore throat, rhinorrhea, sneezing, postnasal drip and sinus pressure. Negative for neck pain and neck stiffness.   Respiratory: Positive for cough. Negative for chest tightness and shortness of breath.   Cardiovascular: Negative for chest pain and palpitations.  Genitourinary: Negative for difficulty urinating.  Psychiatric/Behavioral: Negative.        Objective:   Physical Exam  Constitutional: She is oriented to person, place, and time. She appears well-developed and well-nourished.  HENT:  Right Ear: External ear normal. Tympanic membrane is not erythematous.  Left Ear: External ear normal. Tympanic membrane is not erythematous.  Nose: Mucosal edema and rhinorrhea present. Right sinus exhibits maxillary sinus tenderness and frontal sinus tenderness. Left sinus exhibits maxillary sinus tenderness and frontal sinus tenderness.  Mouth/Throat: Posterior oropharyngeal erythema present.  Eyes: EOM are normal.  Cardiovascular: Normal rate, regular rhythm and normal heart sounds.   Pulmonary/Chest: Effort normal and breath sounds normal. No respiratory distress. She exhibits no tenderness.  Neurological: She is alert and oriented to person, place, and time.  Skin: Skin is warm and dry.  Psychiatric: She has a normal mood and affect.   Results for orders placed in visit on 03/11/13  POCT RAPID STREP A (OFFICE)      Result Value Range   Rapid Strep A Screen Negative  Negative         Assessment & Plan:  1. Sore throat - POCT rapid strep A  2. Acute  upper respiratory infections of unspecified site and 3. Ethmoid sinusitis - amoxicillin (AMOXIL) 500 MG capsule; Take 1 capsule (500 mg total) by mouth 2 (two) times daily.  Dispense: 20 capsule; Refill: 0 -Increase fluid intake -Motrin or tylenol OTC -OTC decongestant -Throat lozenges if help -New toothbrush in 3 days -Proper handwashing  3. Cough -one capsule twice daily  -RTO if symptoms worsen or unresolved -Patient verbalized understanding -Coralie Keens, FNP-C

## 2013-03-26 ENCOUNTER — Ambulatory Visit: Payer: 59 | Admitting: Cardiology

## 2013-04-29 ENCOUNTER — Ambulatory Visit (INDEPENDENT_AMBULATORY_CARE_PROVIDER_SITE_OTHER): Payer: 59 | Admitting: Nurse Practitioner

## 2013-04-29 ENCOUNTER — Encounter: Payer: Self-pay | Admitting: Nurse Practitioner

## 2013-04-29 VITALS — BP 112/69 | HR 57 | Temp 97.2°F | Ht 61.0 in | Wt 144.0 lb

## 2013-04-29 DIAGNOSIS — K219 Gastro-esophageal reflux disease without esophagitis: Secondary | ICD-10-CM

## 2013-04-29 DIAGNOSIS — Z0000 Encounter for general adult medical examination without abnormal findings: Secondary | ICD-10-CM

## 2013-04-29 LAB — POCT CBC
Granulocyte percent: 55.2 %G (ref 37–80)
HCT, POC: 39.9 % (ref 37.7–47.9)
Hemoglobin: 13.6 g/dL (ref 12.2–16.2)
Lymph, poc: 2.2 (ref 0.6–3.4)
MCH, POC: 31.8 pg — AB (ref 27–31.2)
MCHC: 34.1 g/dL (ref 31.8–35.4)
MCV: 93.1 fL (ref 80–97)
MPV: 7.8 fL (ref 0–99.8)
POC Granulocyte: 3 (ref 2–6.9)
POC LYMPH PERCENT: 39.9 %L (ref 10–50)
Platelet Count, POC: 224 10*3/uL (ref 142–424)
RBC: 4.3 M/uL (ref 4.04–5.48)
RDW, POC: 13.3 %
WBC: 5.5 10*3/uL (ref 4.6–10.2)

## 2013-04-29 MED ORDER — DEXLANSOPRAZOLE 60 MG PO CPDR: 60.0000 mg | DELAYED_RELEASE_CAPSULE | Freq: Every day | ORAL | Status: DC

## 2013-04-29 NOTE — Progress Notes (Signed)
  Subjective:    Patient ID: [name], female    DOB: Apr 23, 1951, 62 y.o.   MRN: [id number]  HPI Pt here for annual physical. . Pt has a hx of GERD-it is well controlled with no complaints as long as she takes her Dexilant 60mg . She also states she has a hx of  a lot of UTI's. She states she gets about 5-6 UTI in a year. No other complaints or concerns.    Review of Systems  All other systems reviewed and are negative.       Objective:   Physical Exam  Constitutional: She is oriented to person, place, and time. She appears well-developed and well-nourished.  HENT:  Head: Normocephalic.  Right Ear: External ear normal.  Left Ear: External ear normal.  Mouth/Throat: Oropharynx is clear and moist.  Eyes: Pupils are equal, round, and reactive to light.  Neck: Normal range of motion. Neck supple. No thyromegaly present.  Cardiovascular: Normal rate, regular rhythm, normal heart sounds and intact distal pulses.   Pulmonary/Chest: Effort normal and breath sounds normal.  Abdominal: Soft. Bowel sounds are normal. She exhibits no distension. There is no tenderness.  Musculoskeletal: Normal range of motion. She exhibits no edema and no tenderness.  Neurological: She is alert and oriented to person, place, and time.  Skin: Skin is warm and dry.  Psychiatric: She has a normal mood and affect. Her behavior is normal. Judgment and thought content normal.    BP 112/69  Pulse 57  Temp(Src) 97.2 F (36.2 C) (Oral)  Ht 5\' 1"  (1.549 m)  Wt 144 lb (65.318 kg)  BMI 27.22 kg/m2       Assessment & Plan:  1. GERD (gastroesophageal reflux disease) Avoid spicy and fatty foods - dexlansoprazole (DEXILANT) 60 MG capsule; Take 1 capsule (60 mg total) by mouth daily.  Dispense: 90 capsule; Refill: 1  2. Well adult exam Diet and  exercsie encouraged Mammogram to be scheduled - CMP14+EGFR - NMR, lipoprofile - POCT CBC - Thyroid Panel With TSH  [name], FNP

## 2013-04-29 NOTE — Patient Instructions (Addendum)
Urinary Tract Infection  Urinary tract infections (UTIs) can develop anywhere along your urinary tract. Your urinary tract is your body's drainage system for removing wastes and extra water. Your urinary tract includes two kidneys, two ureters, a bladder, and a urethra. Your kidneys are a pair of bean-shaped organs. Each kidney is about the size of your fist. They are located below your ribs, one on each side of your spine.  CAUSES  Infections are caused by microbes, which are microscopic organisms, including fungi, viruses, and bacteria. These organisms are so small that they can only be seen through a microscope. Bacteria are the microbes that most commonly cause UTIs.  SYMPTOMS   Symptoms of UTIs may vary by age and gender of the patient and by the location of the infection. Symptoms in young women typically include a frequent and intense urge to urinate and a painful, burning feeling in the bladder or urethra during urination. Older women and men are more likely to be tired, shaky, and weak and have muscle aches and abdominal pain. A fever may mean the infection is in your kidneys. Other symptoms of a kidney infection include pain in your back or sides below the ribs, nausea, and vomiting.  DIAGNOSIS  To diagnose a UTI, your caregiver will ask you about your symptoms. Your caregiver also will ask to provide a urine sample. The urine sample will be tested for bacteria and white blood cells. White blood cells are made by your body to help fight infection.  TREATMENT   Typically, UTIs can be treated with medication. Because most UTIs are caused by a bacterial infection, they usually can be treated with the use of antibiotics. The choice of antibiotic and length of treatment depend on your symptoms and the type of bacteria causing your infection.  HOME CARE INSTRUCTIONS   If you were prescribed antibiotics, take them exactly as your caregiver instructs you. Finish the medication even if you feel better after you  have only taken some of the medication.   Drink enough water and fluids to keep your urine clear or pale yellow.   Avoid caffeine, tea, and carbonated beverages. They tend to irritate your bladder.   Empty your bladder often. Avoid holding urine for long periods of time.   Empty your bladder before and after sexual intercourse.   After a bowel movement, women should cleanse from front to back. Use each tissue only once.  SEEK MEDICAL CARE IF:    You have back pain.   You develop a fever.   Your symptoms do not begin to resolve within 3 days.  SEEK IMMEDIATE MEDICAL CARE IF:    You have severe back pain or lower abdominal pain.   You develop chills.   You have nausea or vomiting.   You have continued burning or discomfort with urination.  MAKE SURE YOU:    Understand these instructions.   Will watch your condition.   Will get help right away if you are not doing well or get worse.  Document Released: 06/29/2005 Document Revised: 03/20/2012 Document Reviewed: 10/28/2011  [employer] Patient Information 2014 [employer].

## 2013-05-01 LAB — CMP14+EGFR
ALT: 23 IU/L (ref 0–32)
AST: 25 IU/L (ref 0–40)
Albumin/Globulin Ratio: 1.9 (ref 1.1–2.5)
Albumin: 4.7 g/dL (ref 3.6–4.8)
Alkaline Phosphatase: 91 IU/L (ref 39–117)
BUN/Creatinine Ratio: 23 (ref 11–26)
BUN: 13 mg/dL (ref 8–27)
CO2: 25 mmol/L (ref 18–29)
Calcium: 9.5 mg/dL (ref 8.6–10.2)
Chloride: 102 mmol/L (ref 97–108)
Creatinine, Ser: 0.56 mg/dL — ABNORMAL LOW (ref 0.57–1.00)
GFR calc Af Amer: 116 mL/min/{1.73_m2} (ref >59)
GFR calc non Af Amer: 100 mL/min/{1.73_m2} (ref >59)
Globulin, Total: 2.5 g/dL (ref 1.5–4.5)
Glucose: 85 mg/dL (ref 65–99)
Potassium: 4.8 mmol/L (ref 3.5–5.2)
Sodium: 145 mmol/L — ABNORMAL HIGH (ref 134–144)
Total Bilirubin: 0.7 mg/dL (ref 0.0–1.2)
Total Protein: 7.2 g/dL (ref 6.0–8.5)

## 2013-05-01 LAB — NMR, LIPOPROFILE
Cholesterol: 197 mg/dL (ref <200)
HDL Cholesterol by NMR: 45 mg/dL (ref >=40)
HDL Particle Number: 31.3 umol/L (ref >=30.5)
LDL Particle Number: 1644 nmol/L — ABNORMAL HIGH (ref <1000)
LDL Size: 21.3 nm (ref >20.5)
LDLC SERPL CALC-MCNC: 125 mg/dL — ABNORMAL HIGH (ref <100)
LP-IR Score: 60 — ABNORMAL HIGH (ref <=45)
Small LDL Particle Number: 350 nmol/L (ref <=527)
Triglycerides by NMR: 137 mg/dL (ref <150)

## 2013-05-01 LAB — THYROID PANEL WITH TSH
Free Thyroxine Index: 2.6 (ref 1.2–4.9)
T3 Uptake Ratio: 27 % (ref 24–39)
T4, Total: 9.5 ug/dL (ref 4.5–12.0)
TSH: 2.46 u[IU]/mL (ref 0.450–4.500)

## 2013-05-03 ENCOUNTER — Telehealth: Payer: Self-pay | Admitting: Nurse Practitioner

## 2013-05-06 NOTE — Telephone Encounter (Signed)
Results given on 05-03-13.

## 2013-07-01 ENCOUNTER — Telehealth: Payer: Self-pay | Admitting: Nurse Practitioner

## 2013-07-02 ENCOUNTER — Telehealth: Payer: Self-pay | Admitting: Family Medicine

## 2013-07-02 NOTE — Telephone Encounter (Signed)
Wants a prior authorization on her dexliant

## 2013-08-08 ENCOUNTER — Other Ambulatory Visit: Payer: Self-pay

## 2014-01-24 ENCOUNTER — Other Ambulatory Visit: Payer: Self-pay | Admitting: Nurse Practitioner

## 2014-01-27 NOTE — Telephone Encounter (Signed)
Last seen 04/29/13  MMM Requesting 90 day supply

## 2014-01-27 NOTE — Telephone Encounter (Signed)
Patient NTBS for follow up and lab work  

## 2014-03-18 ENCOUNTER — Telehealth: Payer: Self-pay | Admitting: Nurse Practitioner

## 2014-03-18 NOTE — Telephone Encounter (Signed)
Patient aware last mammogram

## 2014-04-18 ENCOUNTER — Ambulatory Visit (INDEPENDENT_AMBULATORY_CARE_PROVIDER_SITE_OTHER): Payer: 59 | Admitting: Nurse Practitioner

## 2014-04-18 VITALS — BP 138/68 | HR 72 | Temp 98.2°F | Ht 61.0 in | Wt 143.0 lb

## 2014-04-18 DIAGNOSIS — R350 Frequency of micturition: Secondary | ICD-10-CM

## 2014-04-18 DIAGNOSIS — N390 Urinary tract infection, site not specified: Secondary | ICD-10-CM

## 2014-04-18 LAB — POCT UA - MICROSCOPIC ONLY
Casts, Ur, LPF, POC: NEGATIVE
Crystals, Ur, HPF, POC: NEGATIVE
Mucus, UA: NEGATIVE

## 2014-04-18 LAB — POCT URINALYSIS DIPSTICK
Bilirubin, UA: NEGATIVE
Glucose, UA: NEGATIVE
Ketones, UA: NEGATIVE
Leukocytes, UA: NEGATIVE
Nitrite, UA: POSITIVE
Protein, UA: NEGATIVE
Spec Grav, UA: 1.015
Urobilinogen, UA: NEGATIVE
pH, UA: 6

## 2014-04-18 MED ORDER — CIPROFLOXACIN HCL 500 MG PO TABS: 500.0000 mg | ORAL_TABLET | Freq: Two times a day (BID) | ORAL | Status: DC

## 2014-04-18 MED ORDER — FLUCONAZOLE 150 MG PO TABS: 150.0000 mg | ORAL_TABLET | Freq: Once | ORAL | Status: DC

## 2014-04-18 NOTE — Patient Instructions (Signed)

## 2014-04-18 NOTE — Progress Notes (Signed)
   Subjective:    Patient ID: Baird CancerEulalia R Pirani, female    DOB: 11/09/1950, 63 y.o.   MRN: [id number]  HPI Patient in today c/o dysuria , frequency and urgency.Started yesterday- Has taken AZO OTC which has helped with symptoms.    Review of Systems  Constitutional: Negative.   HENT: Negative.   Respiratory: Negative.   Cardiovascular: Negative.   Genitourinary: Positive for dysuria, urgency and frequency.  Neurological: Negative.   Psychiatric/Behavioral: Negative.   All other systems reviewed and are negative.      Objective:   Physical Exam  Constitutional: She is oriented to person, place, and time. She appears well-developed and well-nourished.  Cardiovascular: Normal rate, regular rhythm and normal heart sounds.   Pulmonary/Chest: Effort normal and breath sounds normal.  Abdominal: Soft. Bowel sounds are normal. There is tenderness (mild suprapubic pain on palpation.).  Musculoskeletal: Normal range of motion.  Neurological: She is alert and oriented to person, place, and time.  Skin: Skin is warm and dry.  Psychiatric: She has a normal mood and affect. Her behavior is normal. Judgment and thought content normal.   BP 138/68  Pulse 72  Temp(Src) 98.2 F (36.8 C) (Oral)  Ht 5\' 1"  (1.549 m)  Wt 143 lb (64.864 kg)  BMI 27.03 kg/m2 Results for orders placed in visit on 04/18/14  POCT URINALYSIS DIPSTICK      Result Value Ref Range   Color, UA yellow     Clarity, UA clear     Glucose, UA neg     Bilirubin, UA neg     Ketones, UA neg     Spec Grav, UA 1.015     Blood, UA trace     pH, UA 6.0     Protein, UA neg     Urobilinogen, UA negative     Nitrite, UA pos     Leukocytes, UA Negative    POCT UA - MICROSCOPIC ONLY      Result Value Ref Range   WBC, Ur, HPF, POC rare     RBC, urine, microscopic 1-5     Bacteria, U Microscopic mod     Mucus, UA neg     Epithelial cells, urine per micros rare     Crystals, Ur, HPF, POC neg     Casts, Ur, LPF, POC neg     Yeast, UA many            Assessment & Plan:   1. Frequent urination   2. Recurrent UTI    Meds ordered this encounter  Medications  . ciprofloxacin (CIPRO) 500 MG tablet    Sig: Take 1 tablet (500 mg total) by mouth 2 (two) times daily.    Dispense:  20 tablet    Refill:  0    Order Specific Question:  Supervising Provider    Answer:  Ernestina PennaMOORE, DONALD W [[id number]]  . fluconazole (DIFLUCAN) 150 MG tablet    Sig: Take 1 tablet (150 mg total) by mouth once.    Dispense:  1 tablet    Refill:  0    Order Specific Question:  Supervising Provider    Answer:  Ernestina PennaMOORE, DONALD W [[id number]]   Orders Placed This Encounter  Procedures  . Urine culture  . POCT urinalysis dipstick  . POCT UA - Microscopic Only    Force fluids Continue AZO fro 2 more days RTO prn  Mary-Margaret Daphine DeutscherMartin, FNP

## 2014-04-19 ENCOUNTER — Other Ambulatory Visit: Payer: Self-pay | Admitting: General Practice

## 2014-04-19 LAB — URINE CULTURE

## 2014-05-01 ENCOUNTER — Ambulatory Visit (INDEPENDENT_AMBULATORY_CARE_PROVIDER_SITE_OTHER): Payer: 59 | Admitting: Nurse Practitioner

## 2014-05-01 ENCOUNTER — Encounter: Payer: Self-pay | Admitting: Nurse Practitioner

## 2014-05-01 VITALS — BP 105/78 | HR 69 | Temp 97.2°F | Ht 60.25 in | Wt 144.4 lb

## 2014-05-01 DIAGNOSIS — K219 Gastro-esophageal reflux disease without esophagitis: Secondary | ICD-10-CM

## 2014-05-01 DIAGNOSIS — E559 Vitamin D deficiency, unspecified: Secondary | ICD-10-CM

## 2014-05-01 DIAGNOSIS — J309 Allergic rhinitis, unspecified: Secondary | ICD-10-CM

## 2014-05-01 DIAGNOSIS — Z13820 Encounter for screening for osteoporosis: Secondary | ICD-10-CM

## 2014-05-01 DIAGNOSIS — Z0000 Encounter for general adult medical examination without abnormal findings: Secondary | ICD-10-CM

## 2014-05-01 DIAGNOSIS — Z713 Dietary counseling and surveillance: Secondary | ICD-10-CM

## 2014-05-01 DIAGNOSIS — Z6827 Body mass index (BMI) 27.0-27.9, adult: Secondary | ICD-10-CM

## 2014-05-01 DIAGNOSIS — E785 Hyperlipidemia, unspecified: Secondary | ICD-10-CM

## 2014-05-01 DIAGNOSIS — J302 Other seasonal allergic rhinitis: Secondary | ICD-10-CM

## 2014-05-01 LAB — POCT CBC
Granulocyte percent: 55.4 %G (ref 37–80)
HCT, POC: 41.6 % (ref 37.7–47.9)
Hemoglobin: 13.4 g/dL (ref 12.2–16.2)
Lymph, poc: 2 (ref 0.6–3.4)
MCH, POC: 30.5 pg (ref 27–31.2)
MCHC: 32.2 g/dL (ref 31.8–35.4)
MCV: 94.8 fL (ref 80–97)
MPV: 8.3 fL (ref 0–99.8)
POC Granulocyte: 3.2 (ref 2–6.9)
POC LYMPH PERCENT: 35.6 %L (ref 10–50)
Platelet Count, POC: 223 10*3/uL (ref 142–424)
RBC: 4.4 M/uL (ref 4.04–5.48)
RDW, POC: 12.9 %
WBC: 5.7 10*3/uL (ref 4.6–10.2)

## 2014-05-01 NOTE — Progress Notes (Signed)
Subjective:    Patient ID: Yvonne [name], female    DOB: 04-Apr-1951, 63 y.o.   MRN: [id number]  HPI Patient is here for CPE.  She is doing well without complaints. States she has many UTI since menapause.  Patient Active Problem List   Diagnosis Date Noted  . Seasonal allergic rhinitis 01/23/2013  . Dyspnea 01/23/2013  . Abnormal exercise tolerance test   . GERD (gastroesophageal reflux disease)   . Chest pain   . Hyperlipidemia    Outpatient Encounter Prescriptions as of 05/01/2014  Medication Sig  . DEXILANT 60 MG capsule TAKE 1 CAPSULE DAILY  . ESTRACE VAGINAL 0.1 MG/GM vaginal cream Maybe once a week per pt  . [DISCONTINUED] ciprofloxacin (CIPRO) 500 MG tablet Take 1 tablet (500 mg total) by mouth 2 (two) times daily.  . [DISCONTINUED] fluconazole (DIFLUCAN) 150 MG tablet Take 1 tablet (150 mg total) by mouth once.  . [DISCONTINUED] methylPREDNISolone acetate (DEPO-MEDROL) injection 80 mg        Review of Systems  Constitutional: Negative.   HENT: Negative.   Eyes: Negative.   Respiratory: Negative.   Cardiovascular: Negative.   Gastrointestinal: Negative.   Endocrine: Negative.   Musculoskeletal: Negative.   Skin: Negative.   Allergic/Immunologic: Negative.   Neurological: Negative.   Hematological: Negative.   Psychiatric/Behavioral: Negative.        Objective:   Physical Exam  Constitutional: She is oriented to person, place, and time. She appears well-developed and well-nourished.  HENT:  Head: Normocephalic.  Right Ear: Hearing, tympanic membrane, external ear and ear canal normal.  Left Ear: Hearing, tympanic membrane, external ear and ear canal normal.  Nose: Nose normal.  Mouth/Throat: Uvula is midline, oropharynx is clear and moist and mucous membranes are normal.  Eyes: Conjunctivae and EOM are normal. Pupils are equal, round, and reactive to light.  Neck: Normal range of motion. Neck supple. No JVD present. No thyromegaly present.    Cardiovascular: Normal rate, regular rhythm, normal heart sounds and intact distal pulses.   No murmur heard. Pulmonary/Chest: Effort normal and breath sounds normal. She has no wheezes. She has no rales.  Abdominal: Soft. Bowel sounds are normal. She exhibits no mass.  Musculoskeletal: Normal range of motion.  Neurological: She is alert and oriented to person, place, and time. She has normal reflexes.  Skin: Skin is warm and dry.  Psychiatric: She has a normal mood and affect. Her behavior is normal. Judgment and thought content normal.    BP 105/78  Pulse 69  Temp(Src) 97.2 F (36.2 C) (Oral)  Ht 5' 0.25" (1.53 m)  Wt 144 lb 6.4 oz (65.499 kg)  BMI 27.98 kg/m2       Assessment & Plan:   1. Gastroesophageal reflux disease, esophagitis presence not specified   2. Hyperlipidemia   3. Seasonal allergic rhinitis   4. BMI 27.0-27.9,adult   5. Weight loss counseling, encounter for   6. Screening for osteoporosis   7. Unspecified vitamin D deficiency   8. Annual physical exam    Orders Placed This Encounter  Procedures  . DG Bone Density    Standing Status: Future     Number of Occurrences:      Standing Expiration Date: 07/01/2015    Order Specific Question:  Reason for Exam (SYMPTOM  OR DIAGNOSIS REQUIRED)    Answer:  screening    Order Specific Question:  Preferred imaging location?    Answer:  Internal  . CMP14+EGFR  . NMR, lipoprofile  .  Thyroid Panel With TSH  . Vit D  25 hydroxy (rtn osteoporosis monitoring)  . POCT CBC     Labs pending Health maintenance reviewed Diet and exercise encouraged Continue all meds Follow up  In 6 months   [place], FNP

## 2014-05-01 NOTE — Patient Instructions (Signed)

## 2014-05-02 LAB — NMR, LIPOPROFILE
Cholesterol: 189 mg/dL (ref 100–199)
HDL Cholesterol by NMR: 40 mg/dL (ref >39)
HDL Particle Number: 30.1 umol/L — ABNORMAL LOW (ref >=30.5)
LDL Particle Number: 1252 nmol/L — ABNORMAL HIGH (ref <1000)
LDL Size: 21.1 nm (ref >20.5)
LDLC SERPL CALC-MCNC: 110 mg/dL — ABNORMAL HIGH (ref 0–99)
LP-IR Score: 64 — ABNORMAL HIGH (ref <=45)
Small LDL Particle Number: 430 nmol/L (ref <=527)
Triglycerides by NMR: 195 mg/dL — ABNORMAL HIGH (ref 0–149)

## 2014-05-02 LAB — CMP14+EGFR
ALT: 20 IU/L (ref 0–32)
AST: 23 IU/L (ref 0–40)
Albumin/Globulin Ratio: 1.6 (ref 1.1–2.5)
Albumin: 4.3 g/dL (ref 3.6–4.8)
Alkaline Phosphatase: 86 IU/L (ref 39–117)
BUN/Creatinine Ratio: 22 (ref 11–26)
BUN: 14 mg/dL (ref 8–27)
CO2: 25 mmol/L (ref 18–29)
Calcium: 9.5 mg/dL (ref 8.7–10.3)
Chloride: 102 mmol/L (ref 97–108)
Creatinine, Ser: 0.64 mg/dL (ref 0.57–1.00)
GFR calc Af Amer: 110 mL/min/{1.73_m2} (ref >59)
GFR calc non Af Amer: 95 mL/min/{1.73_m2} (ref >59)
Globulin, Total: 2.7 g/dL (ref 1.5–4.5)
Glucose: 94 mg/dL (ref 65–99)
Potassium: 5.1 mmol/L (ref 3.5–5.2)
Sodium: 142 mmol/L (ref 134–144)
Total Bilirubin: 0.6 mg/dL (ref 0.0–1.2)
Total Protein: 7 g/dL (ref 6.0–8.5)

## 2014-05-02 LAB — THYROID PANEL WITH TSH
Free Thyroxine Index: 2.2 (ref 1.2–4.9)
T3 Uptake Ratio: 26 % (ref 24–39)
T4, Total: 8.6 ug/dL (ref 4.5–12.0)
TSH: 2.15 u[IU]/mL (ref 0.450–4.500)

## 2014-05-02 LAB — VITAMIN D 25 HYDROXY (VIT D DEFICIENCY, FRACTURES): Vit D, 25-Hydroxy: 24.1 ng/mL — ABNORMAL LOW (ref 30.0–100.0)

## 2014-05-05 ENCOUNTER — Ambulatory Visit (INDEPENDENT_AMBULATORY_CARE_PROVIDER_SITE_OTHER): Payer: 59 | Admitting: Family Medicine

## 2014-05-05 VITALS — BP 129/73 | HR 68 | Temp 98.1°F | Ht 60.25 in | Wt 144.6 lb

## 2014-05-05 DIAGNOSIS — IMO0002 Reserved for concepts with insufficient information to code with codable children: Secondary | ICD-10-CM

## 2014-05-05 DIAGNOSIS — R079 Chest pain, unspecified: Secondary | ICD-10-CM

## 2014-05-05 DIAGNOSIS — S39011A Strain of muscle, fascia and tendon of abdomen, initial encounter: Secondary | ICD-10-CM

## 2014-05-05 NOTE — Progress Notes (Signed)
   Subjective:    Patient ID: Baird CancerEulalia R Pittmon, female    DOB: 11/12/1950, 63 y.o.   MRN: [id number]  HPI This 63 y.o. female presents for evaluation of left abdominal discomfort and she states it is below her Left breast.  She states she had some colicky pain in her left upper abdomen yesterday and it has resolved.   Review of Systems No chest pain, SOB, HA, dizziness, vision change, N/V, diarrhea, constipation, dysuria, urinary urgency or frequency, myalgias, arthralgias or rash.     Objective:   Physical Exam  Vital signs noted  Well developed well nourished female.  HEENT - Head atraumatic Normocephalic                Eyes - PERRLA, Conjuctiva - clear Sclera- Clear EOMI                Ears - EAC's Wnl TM's Wnl Gross Hearing WNL                Nose - Nares patent                 Throat - oropharanx wnl Respiratory - Lungs CTA bilateral Cardiac - RRR S1 and S2 without murmur GI - Abdomen soft Nontender and bowel sounds active x 4 Extremities - No edema. Neuro - Grossly intact.  EKG - NSR without acute ST-T changes Deatra CanterWilliam J Oxford FNP    Assessment & Plan:  Left sided chest pain - Plan: EKG 12-Lead  Abdominal muscle strain - Take motrin otc prn  Discussed with patient that her hx and PE does not sound like cardiac etiology but follow up prn  Deatra CanterWilliam J Oxford FNP

## 2014-05-22 ENCOUNTER — Telehealth: Payer: Self-pay | Admitting: Family Medicine

## 2014-05-22 NOTE — Telephone Encounter (Signed)
appt scheduled

## 2014-05-28 ENCOUNTER — Encounter: Payer: Self-pay | Admitting: Nurse Practitioner

## 2014-05-28 ENCOUNTER — Ambulatory Visit (INDEPENDENT_AMBULATORY_CARE_PROVIDER_SITE_OTHER): Payer: 59 | Admitting: Nurse Practitioner

## 2014-05-28 ENCOUNTER — Ambulatory Visit (INDEPENDENT_AMBULATORY_CARE_PROVIDER_SITE_OTHER): Payer: 59

## 2014-05-28 VITALS — BP 114/71 | HR 67 | Temp 98.1°F | Ht 60.25 in | Wt 146.0 lb

## 2014-05-28 DIAGNOSIS — R109 Unspecified abdominal pain: Secondary | ICD-10-CM

## 2014-05-28 NOTE — Patient Instructions (Signed)
Flank Pain °Flank pain refers to pain that is located on the side of the body between the upper abdomen and the back. The pain may occur over a short period of time (acute) or may be long-term or reoccurring (chronic). It may be mild or severe. Flank pain can be caused by many things. °CAUSES  °Some of the more common causes of flank pain include: °· Muscle strains.   °· Muscle spasms.   °· A disease of your spine (vertebral disk disease).   °· A lung infection (pneumonia).   °· Fluid around your lungs (pulmonary edema).   °· A kidney infection.   °· Kidney stones.   °· A very painful skin rash caused by the chickenpox virus (shingles).   °· Gallbladder disease.   °HOME CARE INSTRUCTIONS  °Home care will depend on the cause of your pain. In general, °· Rest as directed by your caregiver. °· Drink enough fluids to keep your urine clear or pale yellow. °· Only take over-the-counter or prescription medicines as directed by your caregiver. Some medicines may help relieve the pain. °· Tell your caregiver about any changes in your pain. °· Follow up with your caregiver as directed. °SEEK IMMEDIATE MEDICAL CARE IF:  °· Your pain is not controlled with medicine.   °· You have new or worsening symptoms. °· Your pain increases.   °· You have abdominal pain.   °· You have shortness of breath.   °· You have persistent nausea or vomiting.   °· You have swelling in your abdomen.   °· You feel faint or pass out.   °· You have blood in your urine. °· You have a fever or persistent symptoms for more than 2-3 days. °· You have a fever and your symptoms suddenly get worse. °MAKE SURE YOU:  °· Understand these instructions. °· Will watch your condition. °· Will get help right away if you are not doing well or get worse. °Document Released: 11/10/2005 Document Revised: 06/13/2012 Document Reviewed: 05/03/2012 °[employer]® Patient Information ©2015 [employer]. This information is not intended to replace advice given to you by your  health care provider. Make sure you discuss any questions you have with your health care provider. ° °

## 2014-05-28 NOTE — Progress Notes (Signed)
   Subjective:    Patient ID: Yvonne [name], female    DOB: [birth year], 63 y.o.   MRN: [id number]  HPI Having left upper chest wall pain under breast, and left upper quadrant pain that feels deep for several months.  Occurs any time of the day lasting for an entire day.  Is not taking any medication for pain relief.  Not experiencing any shortness of breath, nausea or vomiting.   Review of Systems  Constitutional: Negative.   Respiratory: Negative.   Cardiovascular:       Deep pain in left side chest  Gastrointestinal:       Pain in left upper quadrant that radiates laterally under breast   Skin: Negative.        Objective:   Physical Exam  Constitutional: She is oriented to person, place, and time. She appears well-developed and well-nourished.  Cardiovascular: Normal rate, regular rhythm and normal heart sounds.   Pulmonary/Chest: Effort normal and breath sounds normal. She exhibits no tenderness.  Abdominal: Soft. Bowel sounds are normal. She exhibits no distension and no mass. There is no tenderness. There is no rebound and no guarding.  Neurological: She is alert and oriented to person, place, and time.  Skin: Skin is warm and dry.  Psychiatric: She has a normal mood and affect. Her behavior is normal. Judgment and thought content normal.    BP 114/71  Pulse 67  Temp(Src) 98.1 F (36.7 C) (Oral)  Ht 5' 0.25" (1.53 m)  Wt 146 lb (66.225 kg)  BMI 28.29 kg/m2 KUB- gas in colon- others=wise normal-Preliminary reading by Paulene Floor, FNP  [facility] Chest xray- no acute findings-Preliminary reading by Paulene Floor, FNP  [facility]       Assessment & Plan:   1. Left flank pain    Gas x or beano Move when pain develops to see if resolves  [name], FNP

## 2014-06-20 ENCOUNTER — Telehealth: Payer: Self-pay | Admitting: Nurse Practitioner

## 2014-06-20 MED ORDER — DEXLANSOPRAZOLE 60 MG PO CPDR: DELAYED_RELEASE_CAPSULE | ORAL | Status: DC

## 2014-06-20 NOTE — Telephone Encounter (Signed)
done

## 2014-09-29 ENCOUNTER — Ambulatory Visit (INDEPENDENT_AMBULATORY_CARE_PROVIDER_SITE_OTHER): Payer: 59 | Admitting: Family Medicine

## 2014-09-29 ENCOUNTER — Encounter: Payer: Self-pay | Admitting: Family Medicine

## 2014-09-29 VITALS — BP 136/66 | HR 73 | Temp 98.2°F | Ht 60.25 in | Wt 143.0 lb

## 2014-09-29 DIAGNOSIS — K648 Other hemorrhoids: Secondary | ICD-10-CM

## 2014-09-29 MED ORDER — HYDROCORTISONE ACETATE 25 MG RE SUPP: 25.0000 mg | Freq: Two times a day (BID) | RECTAL | Status: DC

## 2014-09-29 MED ORDER — HYDROCORTISONE 2.5 % RE CREA: 1.0000 "application " | TOPICAL_CREAM | Freq: Two times a day (BID) | RECTAL | Status: DC

## 2014-09-29 NOTE — Progress Notes (Signed)
   Subjective:    Patient ID: Yvonne [name], female    DOB: 03/20/1951, 63 y.o.   MRN: [id number]  HPI Patient c/o hemorrhoid discomfort.  Review of Systems  Constitutional: Negative for fever.  HENT: Negative for ear pain.   Eyes: Negative for discharge.  Respiratory: Negative for cough.   Cardiovascular: Negative for chest pain.  Gastrointestinal: Negative for abdominal distention.  Endocrine: Negative for polyuria.  Genitourinary: Negative for difficulty urinating.  Musculoskeletal: Negative for gait problem and neck pain.  Skin: Negative for color change and rash.  Neurological: Negative for speech difficulty and headaches.  Psychiatric/Behavioral: Negative for agitation.       Objective:    BP 136/66 mmHg  Pulse 73  Temp(Src) 98.2 F (36.8 C) (Oral)  Ht 5' 0.25" (1.53 m)  Wt 143 lb (64.864 kg)  BMI 27.71 kg/m2 Physical Exam  Constitutional: She is oriented to person, place, and time. She appears well-developed and well-nourished.  HENT:  Head: Normocephalic and atraumatic.  Mouth/Throat: Oropharynx is clear and moist.  Eyes: Pupils are equal, round, and reactive to light.  Neck: Normal range of motion. Neck supple.  Cardiovascular: Normal rate and regular rhythm.   No murmur heard. Pulmonary/Chest: Effort normal and breath sounds normal.  Abdominal: Soft. Bowel sounds are normal. There is no tenderness.  Neurological: She is alert and oriented to person, place, and time.  Skin: Skin is warm and dry.  Psychiatric: She has a normal mood and affect.          Assessment & Plan:     ICD-9-CM ICD-10-CM   1. Other hemorrhoids 455.6 K64.8 hydrocortisone (ANUSOL-HC) 25 MG suppository     hydrocortisone (ANUSOL-HC) 2.5 % rectal cream     No Follow-up on file.  Deatra CanterWilliam J Oxford FNP

## 2014-10-27 ENCOUNTER — Other Ambulatory Visit (HOSPITAL_COMMUNITY): Payer: Self-pay | Admitting: Cardiology

## 2014-10-27 DIAGNOSIS — R079 Chest pain, unspecified: Secondary | ICD-10-CM

## 2014-11-03 ENCOUNTER — Encounter (HOSPITAL_COMMUNITY)
Admission: RE | Admit: 2014-11-03 | Discharge: 2014-11-03 | Disposition: A | Discharge status: Home / Self Care | Payer: 59 | Source: Ambulatory Visit | Attending: Cardiology | Admitting: Cardiology

## 2014-11-03 ENCOUNTER — Ambulatory Visit (HOSPITAL_COMMUNITY)
Admission: RE | Admit: 2014-11-03 | Discharge: 2014-11-03 | Disposition: A | Discharge status: Home / Self Care | Payer: 59 | Source: Ambulatory Visit | Attending: Cardiology | Admitting: Cardiology

## 2014-11-03 VITALS — BP 146/73 | HR 90

## 2014-11-03 DIAGNOSIS — R079 Chest pain, unspecified: Secondary | ICD-10-CM

## 2014-11-03 LAB — HEPATIC FUNCTION PANEL
ALT: 22 U/L (ref 0–35)
AST: 32 U/L (ref 0–37)
Albumin: 4.3 g/dL (ref 3.5–5.2)
Alkaline Phosphatase: 83 U/L (ref 39–117)
Bilirubin, Direct: 0.3 mg/dL (ref 0.0–0.5)
Indirect Bilirubin: 0.7 mg/dL (ref 0.3–0.9)
Total Bilirubin: 1 mg/dL (ref 0.3–1.2)
Total Protein: 7.1 g/dL (ref 6.0–8.3)

## 2014-11-03 LAB — LIPID PANEL
Cholesterol: 183 mg/dL (ref 0–200)
HDL: 42 mg/dL (ref >39)
LDL Cholesterol: 111 mg/dL — ABNORMAL HIGH (ref 0–99)
Total CHOL/HDL Ratio: 4.4 RATIO
Triglycerides: 150 mg/dL — ABNORMAL HIGH (ref <150)
VLDL: 30 mg/dL (ref 0–40)

## 2014-11-03 LAB — BASIC METABOLIC PANEL
Anion gap: 7 (ref 5–15)
BUN: 17 mg/dL (ref 6–23)
CO2: 31 mmol/L (ref 19–32)
Calcium: 9.5 mg/dL (ref 8.4–10.5)
Chloride: 106 mmol/L (ref 96–112)
Creatinine, Ser: 0.71 mg/dL (ref 0.50–1.10)
GFR calc Af Amer: >90 mL/min (ref >90)
GFR calc non Af Amer: 89 mL/min — ABNORMAL LOW (ref >90)
Glucose, Bld: 79 mg/dL (ref 70–99)
Potassium: 4.2 mmol/L (ref 3.5–5.1)
Sodium: 144 mmol/L (ref 135–145)

## 2014-11-03 MED ORDER — REGADENOSON 0.4 MG/5ML IV SOLN
INTRAVENOUS | Status: AC
  Filled 2014-11-03: qty 5

## 2014-11-03 MED ORDER — TECHNETIUM TC 99M SESTAMIBI GENERIC - CARDIOLITE
10.0000 | Freq: Once | INTRAVENOUS | Status: AC | PRN
  Administered 2014-11-03: 10 via INTRAVENOUS

## 2014-11-03 MED ORDER — REGADENOSON 0.4 MG/5ML IV SOLN
0.4000 mg | Freq: Once | INTRAVENOUS | Status: AC
  Administered 2014-11-03: 0.4 mg via INTRAVENOUS

## 2014-11-03 MED ORDER — TECHNETIUM TC 99M SESTAMIBI GENERIC - CARDIOLITE
30.0000 | Freq: Once | INTRAVENOUS | Status: AC | PRN
  Administered 2014-11-03: 30 via INTRAVENOUS

## 2014-12-16 ENCOUNTER — Other Ambulatory Visit: Payer: Self-pay | Admitting: Nurse Practitioner

## 2014-12-19 ENCOUNTER — Telehealth: Payer: Self-pay | Admitting: Nurse Practitioner

## 2014-12-19 MED ORDER — DEXLANSOPRAZOLE 60 MG PO CPDR: DELAYED_RELEASE_CAPSULE | ORAL | Status: DC

## 2014-12-19 NOTE — Telephone Encounter (Signed)
[facility] no more refills without being seen

## 2014-12-20 ENCOUNTER — Ambulatory Visit (INDEPENDENT_AMBULATORY_CARE_PROVIDER_SITE_OTHER): Payer: 59 | Admitting: Family Medicine

## 2014-12-20 VITALS — BP 125/80 | HR 70 | Temp 96.9°F | Ht 60.0 in | Wt 138.0 lb

## 2014-12-20 DIAGNOSIS — R300 Dysuria: Secondary | ICD-10-CM | POA: Diagnosis not present

## 2014-12-20 DIAGNOSIS — N3090 Cystitis, unspecified without hematuria: Secondary | ICD-10-CM | POA: Diagnosis not present

## 2014-12-20 LAB — POCT URINALYSIS DIPSTICK

## 2014-12-20 LAB — POCT UA - MICROSCOPIC ONLY
Casts, Ur, LPF, POC: NEGATIVE
Crystals, Ur, HPF, POC: NEGATIVE

## 2014-12-20 MED ORDER — CIPROFLOXACIN HCL 500 MG PO TABS: 500.0000 mg | ORAL_TABLET | Freq: Two times a day (BID) | ORAL | Status: DC

## 2014-12-20 NOTE — Telephone Encounter (Signed)
Patient notified at appt today.

## 2014-12-20 NOTE — Progress Notes (Signed)
Subjective:  Patient ID: Yvonne [name], female    DOB: [birth year]  Age: 64 y.o. MRN: [id number]  CC: Dysuria   HPI Yvonne [name] presents for 4 days of burning with urination and vague suprapubic pain. Also frequency. She has hx of frequent UTI. W/U for cause by GYN was neg for causative factors. Denies fever, nausea, back pain. History [name] has a past medical history of Hyperlipidemia; Chest pain; GERD (gastroesophageal reflux disease); Abnormal exercise tolerance test; Anemia; and Diverticulosis.   She has past surgical history that includes Combined hysteroscopy diagnostic / D&C (12/24/2010); Tubal ligation; and Appendectomy.   Her family history includes Diabetes in her sister; Hypertension in her mother; Nephrolithiasis in her mother.She reports that she quit smoking about 31 years ago. She has never used smokeless tobacco. She reports that she does not drink alcohol or use illicit drugs.  Current Outpatient Prescriptions on File Prior to Visit  Medication Sig Dispense Refill  . dexlansoprazole (DEXILANT) 60 MG capsule TAKE 1 CAPSULE DAILY 90 capsule 0  . ESTRACE VAGINAL 0.1 MG/GM vaginal cream Maybe once a week per pt    . hydrocortisone (ANUSOL-HC) 2.5 % rectal cream Place 1 application rectally 2 (two) times daily. 30 g 3  . hydrocortisone (ANUSOL-HC) 25 MG suppository Place 1 suppository (25 mg total) rectally 2 (two) times daily. 12 suppository 3   No current facility-administered medications on file prior to visit.    ROS Review of Systems  Constitutional: Negative for fever, chills and diaphoresis.  HENT: Negative for congestion.   Eyes: Negative for visual disturbance.  Respiratory: Negative for cough and shortness of breath.   Cardiovascular: Negative for chest pain and palpitations.  Gastrointestinal: Negative for nausea, diarrhea and constipation.  Genitourinary: Positive for dysuria, urgency and frequency. Negative for hematuria, flank pain, decreased urine  volume, menstrual problem and pelvic pain.  Musculoskeletal: Negative for joint swelling and arthralgias.  Skin: Negative for rash.  Neurological: Negative for dizziness and numbness.    Objective:  BP 125/80 mmHg  Pulse 70  Temp(Src) 96.9 F (36.1 C) (Oral)  Ht 5' (1.524 m)  Wt 138 lb (62.596 kg)  BMI 26.95 kg/m2  BP Readings from Last 3 Encounters:  12/20/14 125/80  11/03/14 146/73  09/29/14 136/66    Wt Readings from Last 3 Encounters:  12/20/14 138 lb (62.596 kg)  09/29/14 143 lb (64.864 kg)  05/28/14 146 lb (66.225 kg)     Physical Exam  Constitutional: She is oriented to person, place, and time. She appears well-developed and well-nourished. No distress.  HENT:  Head: Normocephalic and atraumatic.  Eyes: Pupils are equal, round, and reactive to light.  Neck: Normal range of motion. Neck supple.  Cardiovascular: Normal rate, regular rhythm and normal heart sounds.   No murmur heard. Pulmonary/Chest: Effort normal and breath sounds normal.  Abdominal: Soft. Bowel sounds are normal. She exhibits no distension. There is tenderness (minimal suprapubic).  Neurological: She is alert and oriented to person, place, and time. She has normal reflexes.  Skin: Skin is warm and dry.  Psychiatric: She has a normal mood and affect. Her behavior is normal. Judgment and thought content normal.    Results for orders placed or performed in visit on 12/20/14  POCT UA - Microscopic Only  Result Value Ref Range   WBC, Ur, HPF, POC occ    RBC, urine, microscopic occ    Bacteria, U Microscopic mod    Mucus, UA many    Epithelial cells, urine  per micros mod    Crystals, Ur, HPF, POC neg    Casts, Ur, LPF, POC neg    Yeast, UA occ   POCT urinalysis dipstick  Result Value Ref Range   Color, UA orange    Clarity, UA clear    Glucose, UA     Bilirubin, UA     Ketones, UA     Spec Grav, UA     Blood, UA     pH, UA     Protein, UA     Urobilinogen, UA     Nitrite, UA      Leukocytes, UA             Assessment & Plan:   [name] was seen today for dysuria.  Diagnoses and all orders for this visit:  Cystitis Orders: -     Urine culture  Dysuria Orders: -     POCT UA - Microscopic Only -     POCT urinalysis dipstick -     Urine culture  Other orders -     ciprofloxacin (CIPRO) 500 MG tablet; Take 1 tablet (500 mg total) by mouth 2 (two) times daily.   I am having Ms. Baray start on ciprofloxacin. I am also having her maintain her ESTRACE VAGINAL, hydrocortisone, hydrocortisone, and dexlansoprazole.  Meds ordered this encounter  Medications  . ciprofloxacin (CIPRO) 500 MG tablet    Sig: Take 1 tablet (500 mg total) by mouth 2 (two) times daily.    Dispense:  20 tablet    Refill:  0     Follow-up: Return if symptoms worsen or fail to improve.  Mechele ClaudeWarren Stacks, M.D.

## 2014-12-24 LAB — URINE CULTURE

## 2015-02-07 ENCOUNTER — Encounter: Payer: Self-pay | Admitting: Family Medicine

## 2015-02-07 ENCOUNTER — Ambulatory Visit (INDEPENDENT_AMBULATORY_CARE_PROVIDER_SITE_OTHER): Payer: 59 | Admitting: Family Medicine

## 2015-02-07 VITALS — BP 123/76 | HR 66 | Temp 98.2°F | Ht 60.0 in | Wt 131.0 lb

## 2015-02-07 DIAGNOSIS — N390 Urinary tract infection, site not specified: Secondary | ICD-10-CM | POA: Diagnosis not present

## 2015-02-07 DIAGNOSIS — R300 Dysuria: Secondary | ICD-10-CM | POA: Diagnosis not present

## 2015-02-07 LAB — POCT UA - MICROSCOPIC ONLY
Casts, Ur, LPF, POC: NEGATIVE
Crystals, Ur, HPF, POC: NEGATIVE

## 2015-02-07 LAB — POCT URINALYSIS DIPSTICK
Bilirubin, UA: NEGATIVE
Glucose, UA: NEGATIVE
Ketones, UA: NEGATIVE
Leukocytes, UA: NEGATIVE
Nitrite, UA: NEGATIVE
Spec Grav, UA: 1.015
Urobilinogen, UA: NEGATIVE
pH, UA: 6

## 2015-02-07 MED ORDER — SULFAMETHOXAZOLE-TRIMETHOPRIM 800-160 MG PO TABS
1.0000 | ORAL_TABLET | Freq: Two times a day (BID) | ORAL | Status: DC
Start: 2015-02-07 — End: 2015-05-29

## 2015-02-07 NOTE — Patient Instructions (Signed)
Drink plenty of fluids Take antibiotic as directed We will arrange an appointment with [facility] in [place].

## 2015-02-07 NOTE — Progress Notes (Signed)
Subjective:    Patient ID: Yvonne [name], female    DOB: 07/15/1951, 64 y.o.   MRN: [id number]  HPI Patient here today for possible UTI. This is been a recurrent problem for a good while. She has frequency and uncomfortable voiding. She stays on Azo over-the-counter almost continuously. She has never seen a urologist. She was last in the office for cystitis in March of this year. She is also seeing the gynecologist and has had what sounds like an ultrasound and was told that everything looked fine. She has been given cream by the urologist and she says this has not helped any either.      Patient Active Problem List   Diagnosis Date Noted  . Seasonal allergic rhinitis 01/23/2013  . Dyspnea 01/23/2013  . Abnormal exercise tolerance test   . GERD (gastroesophageal reflux disease)   . Chest pain   . Hyperlipidemia    Outpatient Encounter Prescriptions as of 02/07/2015  Medication Sig  . dexlansoprazole (DEXILANT) 60 MG capsule TAKE 1 CAPSULE DAILY  . ESTRACE VAGINAL 0.1 MG/GM vaginal cream Maybe once a week per pt  . hydrocortisone (ANUSOL-HC) 2.5 % rectal cream Place 1 application rectally 2 (two) times daily.  . hydrocortisone (ANUSOL-HC) 25 MG suppository Place 1 suppository (25 mg total) rectally 2 (two) times daily.  . [DISCONTINUED] ciprofloxacin (CIPRO) 500 MG tablet Take 1 tablet (500 mg total) by mouth 2 (two) times daily.   No facility-administered encounter medications on file as of 02/07/2015.      Review of Systems  Constitutional: Negative.   HENT: Negative.   Eyes: Negative.   Respiratory: Negative.   Cardiovascular: Negative.   Gastrointestinal: Negative.   Endocrine: Negative.   Genitourinary: Positive for dysuria and frequency.  Musculoskeletal: Negative.   Skin: Negative.   Allergic/Immunologic: Negative.   Neurological: Negative.   Hematological: Negative.   Psychiatric/Behavioral: Negative.        Objective:   Physical Exam  Constitutional: She is  oriented to person, place, and time. She appears well-developed and well-nourished.  Abdominal: Soft. She exhibits no distension. There is tenderness. There is no rebound and no guarding.  Suprapubic tenderness  Musculoskeletal: Normal range of motion.  Neurological: She is alert and oriented to person, place, and time.  Psychiatric: She has a normal mood and affect. Her behavior is normal. Judgment and thought content normal.  Nursing note and vitals reviewed.   BP 123/76 mmHg  Pulse 66  Temp(Src) 98.2 F (36.8 C) (Oral)  Ht 5' (1.524 m)  Wt 131 lb (59.421 kg)  BMI 25.58 kg/m2  The previous urine culture had minimal bacterial growth. The urinalysis today has occasional WBC few RBC and moderate bacteria. She may have some structural issues playing a role with the frequent symptoms that she is having. We will still have her see the urologist and take a course of antibiotics until the culture comes back and if necessary at that time change the antibiotic or decrease the use of the antibiotic to one daily from taking it twice a day.      Assessment & Plan:  1. Dysuria Continue to drink plenty of fluids and take antibiotic as directed POCT UA - Microscopic Only - POCT urinalysis dipstick - Urine culture - Ambulatory referral to Urology  2. Frequent UTI -Continue fluids and take antibiotic - Ambulatory referral to Urology  Meds ordered this encounter  Medications  . sulfamethoxazole-trimethoprim (BACTRIM DS,SEPTRA DS) 800-160 MG per tablet    Sig: Take  1 tablet by mouth 2 (two) times daily.    Dispense:  20 tablet    Refill:  0   Patient Instructions  Drink plenty of fluids Take antibiotic as directed We will arrange an appointment with [facility] in [place].    Nyra Capeson W. Moore MD

## 2015-02-09 LAB — URINE CULTURE

## 2015-02-10 ENCOUNTER — Other Ambulatory Visit (INDEPENDENT_AMBULATORY_CARE_PROVIDER_SITE_OTHER): Payer: 59

## 2015-02-10 ENCOUNTER — Telehealth: Payer: Self-pay | Admitting: Nurse Practitioner

## 2015-02-10 DIAGNOSIS — R350 Frequency of micturition: Secondary | ICD-10-CM | POA: Diagnosis not present

## 2015-02-10 LAB — POCT URINALYSIS DIPSTICK
Bilirubin, UA: NEGATIVE
Glucose, UA: NEGATIVE
Ketones, UA: NEGATIVE
Nitrite, UA: NEGATIVE
Spec Grav, UA: 1.01
Urobilinogen, UA: NEGATIVE
pH, UA: 7

## 2015-02-10 LAB — POCT UA - MICROSCOPIC ONLY
Casts, Ur, LPF, POC: NEGATIVE
Crystals, Ur, HPF, POC: NEGATIVE
Mucus, UA: NEGATIVE
Yeast, UA: NEGATIVE

## 2015-02-10 NOTE — Telephone Encounter (Signed)
Patient states that 30 minutes after she takes antibiotic she gets itchy and then usually within in 30 minutes it is gone. Please advise

## 2015-02-10 NOTE — Telephone Encounter (Signed)
Patient aware and will come by to leave urine specimen. Orders have been placed.

## 2015-02-10 NOTE — Telephone Encounter (Signed)
The patient should stop the sulfa and recheck a urinalysis clean catch midstream and hope that we've treated the infection without taking any more sulfa.

## 2015-02-12 LAB — URINE CULTURE: Organism ID, Bacteria: NO GROWTH

## 2015-03-13 ENCOUNTER — Other Ambulatory Visit: Payer: Self-pay | Admitting: Nurse Practitioner

## 2015-05-29 ENCOUNTER — Ambulatory Visit (INDEPENDENT_AMBULATORY_CARE_PROVIDER_SITE_OTHER): Payer: 59

## 2015-05-29 ENCOUNTER — Ambulatory Visit (INDEPENDENT_AMBULATORY_CARE_PROVIDER_SITE_OTHER): Payer: 59 | Admitting: Family Medicine

## 2015-05-29 ENCOUNTER — Encounter: Payer: Self-pay | Admitting: Family Medicine

## 2015-05-29 VITALS — BP 125/70 | HR 64 | Temp 97.4°F | Ht 60.0 in | Wt 126.0 lb

## 2015-05-29 DIAGNOSIS — Z780 Asymptomatic menopausal state: Secondary | ICD-10-CM | POA: Diagnosis not present

## 2015-05-29 DIAGNOSIS — E785 Hyperlipidemia, unspecified: Secondary | ICD-10-CM

## 2015-05-29 DIAGNOSIS — L237 Allergic contact dermatitis due to plants, except food: Secondary | ICD-10-CM | POA: Diagnosis not present

## 2015-05-29 DIAGNOSIS — Z0000 Encounter for general adult medical examination without abnormal findings: Secondary | ICD-10-CM

## 2015-05-29 LAB — POCT URINALYSIS DIPSTICK
Bilirubin, UA: NEGATIVE
Blood, UA: NEGATIVE
Glucose, UA: NEGATIVE
Ketones, UA: NEGATIVE
Nitrite, UA: NEGATIVE
Protein, UA: NEGATIVE
Spec Grav, UA: 1.01
Urobilinogen, UA: NEGATIVE
pH, UA: 8

## 2015-05-29 LAB — POCT UA - MICROSCOPIC ONLY
Bacteria, U Microscopic: NEGATIVE
Casts, Ur, LPF, POC: NEGATIVE
Crystals, Ur, HPF, POC: NEGATIVE
Mucus, UA: NEGATIVE
RBC, urine, microscopic: NEGATIVE
Yeast, UA: NEGATIVE

## 2015-05-29 MED ORDER — BETAMETHASONE SOD PHOS & ACET 6 (3-3) MG/ML IJ SUSP
6.0000 mg | Freq: Once | INTRAMUSCULAR | Status: AC
  Administered 2015-05-29: 6 mg via INTRAMUSCULAR

## 2015-05-29 NOTE — Progress Notes (Signed)
Subjective:  Patient ID: Yvonne [name], female    DOB: 03-Oct-1951  Age: 64 y.o. MRN: [id number]  CC: Annual Exam and Rash   HPI Yvonne [name] presents for recent nml nuclear stress test. Lost 20 lb eating salads as well as practicing avoiding sweets and Portion control. She is walking daily. History of elevated cholesterol. Diet control History Yvonne [name] has a past medical history of Hyperlipidemia; Chest pain; GERD (gastroesophageal reflux disease); Abnormal exercise tolerance test; Anemia; and Diverticulosis.   She has past surgical history that includes Combined hysteroscopy diagnostic / D&C (12/24/2010); Tubal ligation; and Appendectomy.   Her family history includes Diabetes in her sister; Hypertension in her mother; Nephrolithiasis in her mother.She reports that she quit smoking about 32 years ago. She has never used smokeless tobacco. She reports that she does not drink alcohol or use illicit drugs.  Outpatient Prescriptions Prior to Visit  Medication Sig Dispense Refill  . DEXILANT 60 MG capsule TAKE 1 CAPSULE DAILY 90 capsule 1  . ESTRACE VAGINAL 0.1 MG/GM vaginal cream Maybe once a week per pt    . hydrocortisone (ANUSOL-HC) 2.5 % rectal cream Place 1 application rectally 2 (two) times daily. (Patient not taking: Reported on 05/29/2015) 30 g 3  . hydrocortisone (ANUSOL-HC) 25 MG suppository Place 1 suppository (25 mg total) rectally 2 (two) times daily. (Patient not taking: Reported on 05/29/2015) 12 suppository 3  . sulfamethoxazole-trimethoprim (BACTRIM DS,SEPTRA DS) 800-160 MG per tablet Take 1 tablet by mouth 2 (two) times daily. (Patient not taking: Reported on 05/29/2015) 20 tablet 0   No facility-administered medications prior to visit.    ROS Review of Systems  Constitutional: Negative for fever, chills, diaphoresis, appetite change, fatigue and unexpected weight change.  HENT: Negative for congestion, ear pain, hearing loss, postnasal drip, rhinorrhea, sneezing, sore  throat and trouble swallowing.   Eyes: Negative for pain.  Respiratory: Negative for cough, chest tightness and shortness of breath.   Cardiovascular: Negative for chest pain and palpitations.  Gastrointestinal: Negative for nausea, vomiting, abdominal pain, diarrhea and constipation.  Endocrine: Negative for cold intolerance, heat intolerance, polydipsia, polyphagia and polyuria.  Genitourinary: Negative for dysuria, frequency, decreased urine volume, difficulty urinating, vaginal pain, menstrual problem and pelvic pain.  Musculoskeletal: Negative for myalgias, back pain, joint swelling, arthralgias and neck pain.  Skin: Negative for color change and rash.  Allergic/Immunologic: Negative for environmental allergies and food allergies.  Neurological: Negative for dizziness, tremors, syncope, speech difficulty, weakness, numbness and headaches.  Hematological: Does not bruise/bleed easily.  Psychiatric/Behavioral: Negative for dysphoric mood, decreased concentration and agitation. The patient is not nervous/anxious.     Objective:  BP 125/70 mmHg  Pulse 64  Temp(Src) 97.4 F (36.3 C) (Oral)  Ht 5' (1.524 m)  Wt 126 lb (57.153 kg)  BMI 24.61 kg/m2  BP Readings from Last 3 Encounters:  05/29/15 125/70  02/07/15 123/76  12/20/14 125/80    Wt Readings from Last 3 Encounters:  05/29/15 126 lb (57.153 kg)  02/07/15 131 lb (59.421 kg)  12/20/14 138 lb (62.596 kg)     Physical Exam  Constitutional: She is oriented to person, place, and time. She appears well-developed and well-nourished. No distress.  HENT:  Head: Normocephalic and atraumatic.  Right Ear: External ear normal.  Left Ear: External ear normal.  Nose: Nose normal.  Mouth/Throat: Oropharynx is clear and moist.  Eyes: Conjunctivae and EOM are normal. Pupils are equal, round, and reactive to light.  Neck: Normal range of motion. Neck  supple. No thyromegaly present.  Cardiovascular: Normal rate, regular rhythm and  normal heart sounds.   No murmur heard. Pulmonary/Chest: Effort normal and breath sounds normal. No respiratory distress. She has no wheezes. She has no rales.  Abdominal: Soft. Bowel sounds are normal. She exhibits no distension and no mass. There is no tenderness. There is no rebound and no guarding.  Lymphadenopathy:    She has no cervical adenopathy.  Neurological: She is alert and oriented to person, place, and time. She has normal reflexes.  Skin: Skin is warm and dry. Rash noted. No erythema. No pallor.  Patient has papulovesicular erythema scattered over the forearms and lower legs.  Psychiatric: She has a normal mood and affect. Her behavior is normal. Judgment and thought content normal.    No results found for: HGBA1C  Lab Results  Component Value Date   WBC 5.1 05/29/2015   HGB 13.4 05/01/2014   HCT 39.9 05/29/2015   PLT 238 12/03/2010   GLUCOSE 89 05/29/2015   CHOL 183 05/29/2015   TRIG 97 05/29/2015   HDL 51 05/29/2015   LDLCALC 113* 05/29/2015   ALT 14 05/29/2015   AST 19 05/29/2015   NA 143 05/29/2015   K 4.3 05/29/2015   CL 100 05/29/2015   CREATININE 0.60 05/29/2015   BUN 14 05/29/2015   CO2 27 05/29/2015   TSH 1.930 05/29/2015    Nm Myocar Multi W/spect W/wall Motion / Ef  11/03/2014   CLINICAL DATA:  Chest pain.  EXAM: MYOCARDIAL IMAGING WITH SPECT (REST AND PHARMACOLOGIC-STRESS)  GATED LEFT VENTRICULAR WALL MOTION STUDY  LEFT VENTRICULAR EJECTION FRACTION  TECHNIQUE: Standard myocardial SPECT imaging was performed after resting intravenous injection of 10 mCi Tc-60msestamibi. Subsequently, intravenous infusion of Lexiscan was performed under the supervision of the Cardiology staff. At peak effect of the drug, 30 mCi Tc-950mestamibi was injected intravenously and standard myocardial SPECT imaging was performed. Quantitative gated imaging was also performed to evaluate left ventricular wall motion, and estimate left ventricular ejection fraction.  COMPARISON:   None.  FINDINGS: Perfusion: No decreased activity in the left ventricle on stress imaging to suggest reversible ischemia or infarction.  Wall Motion: Normal left ventricular wall motion. No left ventricular dilation.  Left Ventricular Ejection Fraction: 84 %  End diastolic volume 36 ml  End systolic volume 6 ml  IMPRESSION: 1. No reversible ischemia or infarction.  2. Normal left ventricular wall motion.  3. Left ventricular ejection fraction 84%  4. Low-risk stress test findings*.  *2012 Appropriate Use Criteria for Coronary Revascularization Focused Update: J Am Coll Cardiol. 204944;96(7):591-638http://content.[email or web address]?articleid=1201161   Electronically Signed   By: KeRolm Baptise.D.   On: 11/03/2014 18:14    Assessment & Plan:   EuKyanaas seen today for annual exam and rash.  Diagnoses and all orders for this visit:  Routine general medical examination at a health care facility -     POCT UA - Microscopic Only -     POCT urinalysis dipstick -     Cancel: CBC with Differential/Platelet -     TSH -     Vit D  25 hydroxy (rtn osteoporosis monitoring) -     T4, Free -     DG Bone Density -     CBC with Differential/Platelet  Hyperlipidemia -     Lipid panel -     CMP14+EGFR -     CBC with Differential/Platelet  Postmenopausal -     Vit D  25  hydroxy (rtn osteoporosis monitoring) -     DG Bone Density -     CBC with Differential/Platelet  Poison ivy dermatitis -     betamethasone acetate-betamethasone sodium phosphate (CELESTONE) injection 6 mg; Inject 1 mL (6 mg total) into the muscle once.   I have discontinued Ms. Neuroth's hydrocortisone, hydrocortisone, and sulfamethoxazole-trimethoprim. I am also having her maintain her ESTRACE VAGINAL and DEXILANT. We administered betamethasone acetate-betamethasone sodium phosphate.  Meds ordered this encounter  Medications  . betamethasone acetate-betamethasone sodium phosphate (CELESTONE) injection 6 mg    Sig:       Follow-up: No Follow-up on file.  Claretta Fraise, M.D.

## 2015-05-30 ENCOUNTER — Other Ambulatory Visit: Payer: Self-pay | Admitting: Family Medicine

## 2015-05-30 LAB — CBC WITH DIFFERENTIAL/PLATELET
Basophils Absolute: 0 10*3/uL (ref 0.0–0.2)
Basos: 0 %
EOS (ABSOLUTE): 0.1 10*3/uL (ref 0.0–0.4)
Eos: 1 %
Hematocrit: 39.9 % (ref 34.0–46.6)
Hemoglobin: 13.8 g/dL (ref 11.1–15.9)
Immature Grans (Abs): 0 10*3/uL (ref 0.0–0.1)
Immature Granulocytes: 0 %
Lymphocytes Absolute: 1.8 10*3/uL (ref 0.7–3.1)
Lymphs: 36 %
MCH: 32.3 pg (ref 26.6–33.0)
MCHC: 34.6 g/dL (ref 31.5–35.7)
MCV: 93 fL (ref 79–97)
Monocytes Absolute: 0.3 10*3/uL (ref 0.1–0.9)
Monocytes: 7 %
Neutrophils Absolute: 2.8 10*3/uL (ref 1.4–7.0)
Neutrophils: 56 %
Platelets: 167 10*3/uL (ref 150–379)
RBC: 4.27 x10E6/uL (ref 3.77–5.28)
RDW: 13.7 % (ref 12.3–15.4)
WBC: 5.1 10*3/uL (ref 3.4–10.8)

## 2015-05-30 LAB — CMP14+EGFR
ALT: 14 IU/L (ref 0–32)
AST: 19 IU/L (ref 0–40)
Albumin/Globulin Ratio: 1.6 (ref 1.1–2.5)
Albumin: 4.6 g/dL (ref 3.6–4.8)
Alkaline Phosphatase: 79 IU/L (ref 39–117)
BUN/Creatinine Ratio: 23 (ref 11–26)
BUN: 14 mg/dL (ref 8–27)
Bilirubin Total: 0.7 mg/dL (ref 0.0–1.2)
CO2: 27 mmol/L (ref 18–29)
Calcium: 9.3 mg/dL (ref 8.7–10.3)
Chloride: 100 mmol/L (ref 97–108)
Creatinine, Ser: 0.6 mg/dL (ref 0.57–1.00)
GFR calc Af Amer: 111 mL/min/{1.73_m2} (ref >59)
GFR calc non Af Amer: 97 mL/min/{1.73_m2} (ref >59)
Globulin, Total: 2.8 g/dL (ref 1.5–4.5)
Glucose: 89 mg/dL (ref 65–99)
Potassium: 4.3 mmol/L (ref 3.5–5.2)
Sodium: 143 mmol/L (ref 134–144)
Total Protein: 7.4 g/dL (ref 6.0–8.5)

## 2015-05-30 LAB — LIPID PANEL
Chol/HDL Ratio: 3.6 ratio units (ref 0.0–4.4)
Cholesterol, Total: 183 mg/dL (ref 100–199)
HDL: 51 mg/dL (ref >39)
LDL Calculated: 113 mg/dL — ABNORMAL HIGH (ref 0–99)
Triglycerides: 97 mg/dL (ref 0–149)
VLDL Cholesterol Cal: 19 mg/dL (ref 5–40)

## 2015-05-30 LAB — VITAMIN D 25 HYDROXY (VIT D DEFICIENCY, FRACTURES): Vit D, 25-Hydroxy: 27.1 ng/mL — ABNORMAL LOW (ref 30.0–100.0)

## 2015-05-30 LAB — T4, FREE: Free T4: 1.38 ng/dL (ref 0.82–1.77)

## 2015-05-30 LAB — TSH: TSH: 1.93 u[IU]/mL (ref 0.450–4.500)

## 2015-05-30 MED ORDER — VITAMIN D (ERGOCALCIFEROL) 1.25 MG (50000 UNIT) PO CAPS: 50000.0000 [IU] | ORAL_CAPSULE | ORAL | Status: DC

## 2015-06-01 ENCOUNTER — Telehealth: Payer: Self-pay | Admitting: Nurse Practitioner

## 2015-06-01 ENCOUNTER — Telehealth: Payer: Self-pay | Admitting: Family Medicine

## 2015-06-01 MED ORDER — PREDNISONE 10 MG PO TABS: ORAL_TABLET | ORAL | Status: DC

## 2015-06-01 NOTE — Progress Notes (Signed)
Patient aware.

## 2015-06-01 NOTE — Telephone Encounter (Signed)
Not for another week. However I did send in a prescription for prednisone that should help.

## 2015-06-01 NOTE — Telephone Encounter (Signed)
Stp and advised of MD feedback.

## 2015-06-02 ENCOUNTER — Telehealth: Payer: Self-pay | Admitting: Nurse Practitioner

## 2015-06-02 MED ORDER — PREDNISONE 10 MG PO TABS: ORAL_TABLET | ORAL | Status: DC

## 2015-06-02 NOTE — Telephone Encounter (Signed)
Spoke with pt- advised her that I resent prescription to [facility] in [place].  [facility]

## 2015-06-16 ENCOUNTER — Telehealth: Payer: Self-pay | Admitting: Nurse Practitioner

## 2015-06-17 ENCOUNTER — Other Ambulatory Visit: Payer: Self-pay | Admitting: Nurse Practitioner

## 2015-06-17 MED ORDER — PREDNISONE 20 MG PO TABS: ORAL_TABLET | ORAL | Status: DC

## 2015-06-17 NOTE — Telephone Encounter (Signed)
Left message on both home & cell #'s that prescription has been sent to [facility] in [place]

## 2015-06-17 NOTE — Telephone Encounter (Signed)
Will send in prednisone rx to pharmacy

## 2015-06-17 NOTE — Progress Notes (Signed)
Completed in another encounter note. Messages left on both phone & cell #'s

## 2015-10-27 ENCOUNTER — Ambulatory Visit (INDEPENDENT_AMBULATORY_CARE_PROVIDER_SITE_OTHER): Payer: 59 | Admitting: Family

## 2015-10-27 ENCOUNTER — Encounter: Payer: Self-pay | Admitting: Family

## 2015-10-27 VITALS — BP 132/77 | HR 83 | Temp 100.3°F | Ht 60.0 in | Wt 123.0 lb

## 2015-10-27 DIAGNOSIS — R509 Fever, unspecified: Secondary | ICD-10-CM | POA: Diagnosis not present

## 2015-10-27 DIAGNOSIS — J0100 Acute maxillary sinusitis, unspecified: Secondary | ICD-10-CM | POA: Diagnosis not present

## 2015-10-27 LAB — POCT INFLUENZA A/B
Influenza A, POC: NEGATIVE
Influenza B, POC: NEGATIVE

## 2015-10-27 MED ORDER — AMOXICILLIN-POT CLAVULANATE 875-125 MG PO TABS: 1.0000 | ORAL_TABLET | Freq: Two times a day (BID) | ORAL | Status: DC

## 2015-10-27 NOTE — Progress Notes (Signed)
Subjective:    Patient ID: [name], female    DOB: 1951/04/15, 65 y.o.   MRN: [id number]  Cough Associated symptoms include chills, ear pain, a fever, headaches and a sore throat. Pertinent negatives include no shortness of breath.  Sinus Problem This is a new problem. The current episode started in the past 7 days. The problem has been gradually worsening since onset. The maximum temperature recorded prior to her arrival was 100.4 - 100.9 F. Her pain is at a severity of 6/10. The pain is mild. Associated symptoms include chills, congestion, coughing, ear pain, headaches, a hoarse voice, sinus pressure, sneezing and a sore throat. Pertinent negatives include no shortness of breath. Past treatments include acetaminophen and oral decongestants. The treatment provided mild relief.  Fever  Associated symptoms include congestion, coughing, ear pain, headaches and a sore throat.      Review of Systems  Constitutional: Positive for fever and chills.  HENT: Positive for congestion, ear pain, hoarse voice, sinus pressure, sneezing and sore throat.   Eyes: Negative.   Respiratory: Positive for cough. Negative for shortness of breath.   Cardiovascular: Negative.  Negative for palpitations.  Gastrointestinal: Negative.   Endocrine: Negative.   Genitourinary: Negative.   Musculoskeletal: Negative.   Neurological: Positive for headaches.  Hematological: Negative.   Psychiatric/Behavioral: Negative.   All other systems reviewed and are negative.      Objective:   Physical Exam  Constitutional: She is oriented to person, place, and time. She appears well-developed and well-nourished. No distress.  HENT:  Head: Normocephalic and atraumatic.  Right Ear: External ear normal.  Left Ear: External ear normal.  Nose: Right sinus exhibits maxillary sinus tenderness. Left sinus exhibits maxillary sinus tenderness.  Nasal passage erythemas with mild swelling  Oropharynx erythemas  Eyes:  Pupils are equal, round, and reactive to light.  Neck: Normal range of motion. Neck supple. No thyromegaly present.  Cardiovascular: Normal rate, regular rhythm, normal heart sounds and intact distal pulses.   No murmur heard. Pulmonary/Chest: Effort normal and breath sounds normal. No respiratory distress. She has no wheezes.  Abdominal: Soft. Bowel sounds are normal. She exhibits no distension. There is no tenderness.  Musculoskeletal: Normal range of motion. She exhibits no edema or tenderness.  Neurological: She is alert and oriented to person, place, and time. She has normal reflexes. No cranial nerve deficit.  Skin: Skin is warm and dry.  Psychiatric: She has a normal mood and affect. Her behavior is normal. Judgment and thought content normal.  Vitals reviewed.     BP 132/77 mmHg  Pulse 83  Temp(Src) 100.3 F (37.9 C) (Oral)  Ht 5' (1.524 m)  Wt 123 lb (55.792 kg)  BMI 24.02 kg/m2     Assessment & Plan:  1. Fever, unspecified fever cause - POCT Influenza A/B  2. Acute maxillary sinusitis, recurrence not specified -- Take meds as prescribed - Use a cool mist humidifier  -Use saline nose sprays frequently -Saline irrigations of the nose can be very helpful if done frequently.  * 4X daily for 1 week*  * Use of a nettie pot can be helpful with this. Follow directions with this* -Force fluids -For any cough or congestion  Use plain Mucinex- regular strength or max strength is fine   * Children- consult with Pharmacist for dosing -For fever or aces or pains- take tylenol or ibuprofen appropriate for age and weight.  * for fevers greater than 101 orally you may alternate ibuprofen and  tylenol every  3 hours. -Throat lozenges if help -New toothbrush in 3 days - amoxicillin-clavulanate (AUGMENTIN) 875-125 MG tablet; Take 1 tablet by mouth 2 (two) times daily.  Dispense: 14 tablet; Refill: 0  Jannifer Rodney, FNP

## 2015-10-27 NOTE — Patient Instructions (Addendum)
Sinusitis, Adult Sinusitis is redness, soreness, and inflammation of the paranasal sinuses. Paranasal sinuses are air pockets within the bones of your face. They are located beneath your eyes, in the middle of your forehead, and above your eyes. In healthy paranasal sinuses, mucus is able to drain out, and air is able to circulate through them by way of your nose. However, when your paranasal sinuses are inflamed, mucus and air can become trapped. This can allow bacteria and other germs to grow and cause infection. Sinusitis can develop quickly and last only a short time (acute) or continue over a long period (chronic). Sinusitis that lasts for more than 12 weeks is considered chronic. CAUSES Causes of sinusitis include:  Allergies.  Structural abnormalities, such as displacement of the cartilage that separates your nostrils (deviated septum), which can decrease the air flow through your nose and sinuses and affect sinus drainage.  Functional abnormalities, such as when the small hairs (cilia) that line your sinuses and help remove mucus do not work properly or are not present. SIGNS AND SYMPTOMS Symptoms of acute and chronic sinusitis are the same. The primary symptoms are pain and pressure around the affected sinuses. Other symptoms include:  Upper toothache.  Earache.  Headache.  Bad breath.  Decreased sense of smell and taste.  A cough, which worsens when you are lying flat.  Fatigue.  Fever.  Thick drainage from your nose, which often is green and may contain pus (purulent).  Swelling and warmth over the affected sinuses. DIAGNOSIS Your health care provider will perform a physical exam. During your exam, your health care provider may perform any of the following to help determine if you have acute sinusitis or chronic sinusitis:  Look in your nose for signs of abnormal growths in your nostrils (nasal polyps).  Tap over the affected sinus to check for signs of  infection.  View the inside of your sinuses using an imaging device that has a light attached (endoscope). If your health care provider suspects that you have chronic sinusitis, one or more of the following tests may be recommended:  Allergy tests.  Nasal culture. A sample of mucus is taken from your nose, sent to a lab, and screened for bacteria.  Nasal cytology. A sample of mucus is taken from your nose and examined by your health care provider to determine if your sinusitis is related to an allergy. TREATMENT Most cases of acute sinusitis are related to a viral infection and will resolve on their own within 10 days. Sometimes, medicines are prescribed to help relieve symptoms of both acute and chronic sinusitis. These may include pain medicines, decongestants, nasal steroid sprays, or saline sprays. However, for sinusitis related to a bacterial infection, your health care provider will prescribe antibiotic medicines. These are medicines that will help kill the bacteria causing the infection. Rarely, sinusitis is caused by a fungal infection. In these cases, your health care provider will prescribe antifungal medicine. For some cases of chronic sinusitis, surgery is needed. Generally, these are cases in which sinusitis recurs more than 3 times per year, despite other treatments. HOME CARE INSTRUCTIONS  Drink plenty of water. Water helps thin the mucus so your sinuses can drain more easily.  Use a humidifier.  Inhale steam 3-4 times a day (for example, sit in the bathroom with the shower running).  Apply a warm, moist washcloth to your face 3-4 times a day, or as directed by your health care provider.  Use saline nasal sprays to help   moisten and clean your sinuses.  Take medicines only as directed by your health care provider.  If you were prescribed either an antibiotic or antifungal medicine, finish it all even if you start to feel better. SEEK IMMEDIATE MEDICAL CARE IF:  You have  increasing pain or severe headaches.  You have nausea, vomiting, or drowsiness.  You have swelling around your face.  You have vision problems.  You have a stiff neck.  You have difficulty breathing.   This information is not intended to replace advice given to you by your health care provider. Make sure you discuss any questions you have with your health care provider.   Document Released: 09/19/2005 Document Revised: 10/10/2014 Document Reviewed: 10/04/2011 Elsevier Interactive Patient Education 2016 [employer]  - Take meds as prescribed - Use a cool mist humidifier  -Use saline nose sprays frequently -Saline irrigations of the nose can be very helpful if done frequently.  * 4X daily for 1 week*  * Use of a nettie pot can be helpful with this. Follow directions with this* -Force fluids -For any cough or congestion  Use plain Mucinex- regular strength or max strength is fine   * Children- consult with Pharmacist for dosing -For fever or aces or pains- take tylenol or ibuprofen appropriate for age and weight.  * for fevers greater than 101 orally you may alternate ibuprofen and tylenol every  3 hours. -Throat lozenges if help -New toothbrush in 3 days   Christy Hawks, FNP   

## 2016-03-16 ENCOUNTER — Ambulatory Visit (INDEPENDENT_AMBULATORY_CARE_PROVIDER_SITE_OTHER): Payer: 59 | Admitting: Family Medicine

## 2016-03-16 ENCOUNTER — Encounter: Payer: Self-pay | Admitting: Family Medicine

## 2016-03-16 VITALS — BP 128/73 | HR 62 | Temp 98.2°F | Ht 60.0 in | Wt 121.8 lb

## 2016-03-16 DIAGNOSIS — R1084 Generalized abdominal pain: Secondary | ICD-10-CM

## 2016-03-16 LAB — MICROSCOPIC EXAMINATION

## 2016-03-16 LAB — URINALYSIS, COMPLETE
Bilirubin, UA: NEGATIVE
Ketones, UA: NEGATIVE
Nitrite, UA: POSITIVE — AB
Protein, UA: NEGATIVE
RBC, UA: NEGATIVE
Specific Gravity, UA: 1.01 (ref 1.005–1.030)
Urobilinogen, Ur: 1 mg/dL (ref 0.2–1.0)
pH, UA: 7 (ref 5.0–7.5)

## 2016-03-16 MED ORDER — DEXLANSOPRAZOLE 60 MG PO CPDR: 1.0000 | DELAYED_RELEASE_CAPSULE | Freq: Every day | ORAL | Status: DC

## 2016-03-16 MED ORDER — NITROFURANTOIN MACROCRYSTAL 50 MG PO CAPS: 50.0000 mg | ORAL_CAPSULE | Freq: Four times a day (QID) | ORAL | Status: DC

## 2016-03-16 NOTE — Progress Notes (Signed)
   Subjective:    Patient ID: Yvonne [name], female    DOB: 11/15/1950, 65 y.o.   MRN: [id number]  HPI 65 year old female with recurrent UTIs. Symptoms have gotten worse since she has gone through menopause. They were better for a time when she was using daily estrogen cream but her urologist told her to only use that twice a week. She has also been on Macrodantin from the urologist and use that whenever she would get symptoms but she has run out of that. She does see a connection with sexual activity and has read where it has been recommended to take one antibiotic dose after sexual activity but nobody has ever suggested that to her before today. Symptoms are lower urinary tract not kidney infection.  Patient Active Problem List   Diagnosis Date Noted  . Seasonal allergic rhinitis 01/23/2013  . GERD (gastroesophageal reflux disease)   . Hyperlipidemia    Outpatient Encounter Prescriptions as of 03/16/2016  Medication Sig  . DEXILANT 60 MG capsule TAKE 1 CAPSULE DAILY  . ESTRACE VAGINAL 0.1 MG/GM vaginal cream Maybe once a week per pt  . [DISCONTINUED] amoxicillin-clavulanate (AUGMENTIN) 875-125 MG tablet Take 1 tablet by mouth 2 (two) times daily.  . [DISCONTINUED] Vitamin D, Ergocalciferol, (DRISDOL) 50000 UNITS CAPS capsule Take 1 capsule (50,000 Units total) by mouth 2 (two) times a week. (Patient not taking: Reported on 10/27/2015)   No facility-administered encounter medications on file as of 03/16/2016.      Review of Systems  Gastrointestinal: Positive for abdominal pain.  Genitourinary: Positive for dysuria.       Objective:   Physical Exam  Constitutional: She appears well-developed and well-nourished.  Cardiovascular: Normal rate.   Abdominal: Soft.   BP 128/73 mmHg  Pulse 62  Temp(Src) 98.2 F (36.8 C) (Oral)  Ht 5' (1.524 m)  Wt 121 lb 12.8 oz (55.248 kg)  BMI 23.79 kg/m2        Assessment & Plan:  1. Generalized abdominal pain Urine analysis is abnormal.  Has moderate bacteria and 6-10 white blood cells. We'll plan to do culture and restart Macrodantin. This time I want her to take 4 times a day for a week and then once after sex. Continue to drink fluids and use estrogen cream every other day rather than twice a week. - Urinalysis, Complete

## 2016-03-16 NOTE — Addendum Note (Signed)
Addended by: Fawn KirkHOLT, CATHY on: 03/16/2016 02:31 PM   Modules accepted: Orders

## 2016-04-07 ENCOUNTER — Telehealth: Payer: Self-pay | Admitting: Nurse Practitioner

## 2016-04-12 ENCOUNTER — Telehealth: Payer: Self-pay

## 2016-04-12 NOTE — Telephone Encounter (Signed)
Insurance prior authorized [employer] DevicesDexilant

## 2016-04-13 ENCOUNTER — Other Ambulatory Visit: Payer: Self-pay | Admitting: Family Medicine

## 2016-04-22 ENCOUNTER — Telehealth: Payer: Self-pay | Admitting: Nurse Practitioner

## 2016-04-25 NOTE — Telephone Encounter (Signed)
Pt will call back to schedule

## 2016-06-28 ENCOUNTER — Ambulatory Visit: Payer: 59 | Admitting: Physician Assistant

## 2016-07-23 ENCOUNTER — Ambulatory Visit (INDEPENDENT_AMBULATORY_CARE_PROVIDER_SITE_OTHER): Payer: 59 | Admitting: Pediatrics

## 2016-07-23 ENCOUNTER — Encounter: Payer: Self-pay | Admitting: Pediatrics

## 2016-07-23 VITALS — BP 104/69 | HR 60 | Temp 97.3°F | Ht 61.52 in | Wt 124.6 lb

## 2016-07-23 DIAGNOSIS — N390 Urinary tract infection, site not specified: Secondary | ICD-10-CM | POA: Diagnosis not present

## 2016-07-23 DIAGNOSIS — M545 Low back pain: Secondary | ICD-10-CM

## 2016-07-23 DIAGNOSIS — R8281 Pyuria: Secondary | ICD-10-CM

## 2016-07-23 MED ORDER — CIPROFLOXACIN HCL 250 MG PO TABS: 250.0000 mg | ORAL_TABLET | Freq: Two times a day (BID) | ORAL | 0 refills | Status: DC

## 2016-07-23 MED ORDER — NITROFURANTOIN MACROCRYSTAL 50 MG PO CAPS: 100.0000 mg | ORAL_CAPSULE | Freq: Two times a day (BID) | ORAL | 1 refills | Status: DC

## 2016-07-23 NOTE — Progress Notes (Signed)
  Subjective:   Patient ID: Yvonne [name], female    DOB: 1951-06-01, 65 y.o.   MRN: [id number] CC: Back Pain (x 1 day) and Abdominal Pain  HPI: Yvonne [name] is a 65 y.o. female presenting for Back Pain (x 1 day) and Abdominal Pain  Feels like prior urine infections No fevers No dysuria Lower abd pain with voiding Has had frequent UTIs Seen by urology Has been on post-coital ppx in the past Now doesn't think UTIs start in relation to intercourse Using estrace cream twice a week unless she forgets Using monostat cream regularly for itching/burning   Relevant past medical, surgical, family and social history reviewed. Allergies and medications reviewed and updated. History  Smoking Status  . Former Smoker  . Quit date: 03/12/1983  Smokeless Tobacco  . Never Used    Comment: quit over 30 years ago   ROS: Per HPI   Objective:    BP 104/69   Pulse 60   Temp 97.3 F (36.3 C) (Oral)   Ht 5' 1.52" (1.563 m)   Wt 124 lb 9.6 oz (56.5 kg)   BMI 23.14 kg/m   Wt Readings from Last 3 Encounters:  07/23/16 124 lb 9.6 oz (56.5 kg)  03/16/16 121 lb 12.8 oz (55.2 kg)  10/27/15 123 lb (55.8 kg)    Gen: NAD, alert, cooperative with exam, NCAT EYES: EOMI, no conjunctival injection, or no icterus ENT:  R TM occluded by cerumen, L TM pearly gray, TMs pearly gray b/l, OP without erythema CV: NRRR Resp: CTAB, normal WOB Abd: soft, NTND.  Ext: No edema, warm Neuro: Alert and oriented MSK: normal muscle bulk  Assessment & Plan:  Vantasia was seen today for back pain and abdominal pain.  Diagnoses and all orders for this visit:  Low back pain, unspecified back pain laterality, unspecified chronicity, with sciatica presence unspecified -     Urinalysis  Pyuria -     ciprofloxacin (CIPRO) 250 MG tablet; Take 1 tablet (250 mg total) by mouth 2 (two) times daily. -     nitrofurantoin (MACRODANTIN) 50 MG capsule; Take 2 capsules (100 mg total) by mouth 2 (two) times daily. For 3  days -     Urine culture  Recurrent UTI -     nitrofurantoin (MACRODANTIN) 50 MG capsule; Take 2 capsules (100 mg total) by mouth 2 (two) times daily. For 3 days   Follow up plan: 4 weeks Rex Kras, MD [place] [facility]

## 2016-07-24 LAB — URINE CULTURE: Organism ID, Bacteria: NO GROWTH

## 2016-07-25 LAB — URINALYSIS
Bilirubin, UA: NEGATIVE
Glucose, UA: NEGATIVE
Ketones, UA: NEGATIVE
Leukocytes, UA: NEGATIVE
Nitrite, UA: POSITIVE — AB
Protein, UA: NEGATIVE
Specific Gravity, UA: 1.015 (ref 1.005–1.030)
Urobilinogen, Ur: 0.2 mg/dL (ref 0.2–1.0)
pH, UA: 7 (ref 5.0–7.5)

## 2016-10-31 ENCOUNTER — Other Ambulatory Visit: Payer: Self-pay | Admitting: Family Medicine

## 2016-10-31 MED ORDER — OSELTAMIVIR PHOSPHATE 75 MG PO CAPS: 75.0000 mg | ORAL_CAPSULE | Freq: Every day | ORAL | 0 refills | Status: DC

## 2016-12-12 ENCOUNTER — Ambulatory Visit (INDEPENDENT_AMBULATORY_CARE_PROVIDER_SITE_OTHER): Payer: 59 | Admitting: Family Medicine

## 2016-12-12 ENCOUNTER — Encounter (INDEPENDENT_AMBULATORY_CARE_PROVIDER_SITE_OTHER): Payer: Self-pay

## 2016-12-12 ENCOUNTER — Encounter: Payer: Self-pay | Admitting: Family Medicine

## 2016-12-12 VITALS — BP 113/58 | HR 63 | Temp 96.8°F | Ht 61.52 in | Wt 125.8 lb

## 2016-12-12 DIAGNOSIS — R350 Frequency of micturition: Secondary | ICD-10-CM | POA: Diagnosis not present

## 2016-12-12 DIAGNOSIS — N3281 Overactive bladder: Secondary | ICD-10-CM | POA: Diagnosis not present

## 2016-12-12 LAB — URINALYSIS, COMPLETE
Bilirubin, UA: NEGATIVE
Glucose, UA: NEGATIVE
Ketones, UA: NEGATIVE
Leukocytes, UA: NEGATIVE
Nitrite, UA: NEGATIVE
Protein, UA: NEGATIVE
RBC, UA: NEGATIVE
Specific Gravity, UA: 1.015 (ref 1.005–1.030)
Urobilinogen, Ur: 0.2 mg/dL (ref 0.2–1.0)
pH, UA: 5.5 (ref 5.0–7.5)

## 2016-12-12 LAB — MICROSCOPIC EXAMINATION
Bacteria, UA: NONE SEEN
Epithelial Cells (non renal): >10 /hpf — AB (ref 0–10)
RBC, UA: NONE SEEN /hpf (ref 0–?)
Renal Epithel, UA: NONE SEEN /hpf

## 2016-12-12 MED ORDER — FLUCONAZOLE 150 MG PO TABS: ORAL_TABLET | ORAL | 0 refills | Status: DC

## 2016-12-12 MED ORDER — MIRABEGRON ER 25 MG PO TB24: 25.0000 mg | ORAL_TABLET | Freq: Every day | ORAL | 2 refills | Status: DC

## 2016-12-12 NOTE — Progress Notes (Signed)
   HPI  Patient presents today here with increased urinary frequency.  Patient explains that she's had this problem recurrently.  She complains of lower abdominal pain in the suprapubic area, increased urinary frequency, and external dysuria.  She states that the burning when she urinates his intermittent and is mostly on the outside.  Patient states that she has seen urology who discussed interstitial cystitis treatments, conservative diet changes.  PMH: Smoking status noted ROS: Per HPI  Objective: BP (!) 113/58   Pulse 63   Temp (!) 96.8 F (36 C) (Oral)   Ht 5' 1.52" (1.563 m)   Wt 125 lb 12.8 oz (57.1 kg)   BMI 23.37 kg/m  Gen: NAD, alert, cooperative with exam HEENT: NCAT, EOMI, PERRL CV: RRR, good S1/S2, no murmur Resp: CTABL, no wheezes, non-labored Abd: SNTND, BS present, no guarding or organomegaly  Ext: No edema, warm Neuro: Alert and oriented, No gross deficits  Assessment and plan:  # Overactive bladder with increased urinary frequency. Likely  diagnosis of overactive bladder versus interstitial cystitis Culture to r/o infection, lots of epis on micro Diflucan X 2 with external urethritis Myrbetriq, f/u 2 months with PCP.  Discussed 2 months until full onset, may need to titrate to 50 mg in 2 months if not well controlled    Orders Placed This Encounter  Procedures  . Urine culture  . Urinalysis, Complete    Meds ordered this encounter  Medications  . mirabegron ER (MYRBETRIQ) 25 MG TB24 tablet    Sig: Take 1 tablet (25 mg total) by mouth daily.    Dispense:  30 tablet    Refill:  2  . fluconazole (DIFLUCAN) 150 MG tablet    Sig: Take one pill and repeat in 3 days    Dispense:  2 tablet    Refill:  0    Murtis SinkSam Bradshaw, MD [facility] 12/12/2016, 11:02 AM

## 2016-12-12 NOTE — Patient Instructions (Addendum)
Great to meet you!  Start myrbetriq 1 pill once daily, improvement is usually observed within 2 months  Come back to see you PCP in 2 months

## 2016-12-14 LAB — URINE CULTURE

## 2017-01-14 ENCOUNTER — Ambulatory Visit (INDEPENDENT_AMBULATORY_CARE_PROVIDER_SITE_OTHER): Payer: 59 | Admitting: Family

## 2017-01-14 ENCOUNTER — Encounter: Payer: Self-pay | Admitting: Family

## 2017-01-14 VITALS — BP 124/75 | HR 68 | Temp 97.9°F | Ht 61.5 in | Wt 123.4 lb

## 2017-01-14 DIAGNOSIS — R109 Unspecified abdominal pain: Secondary | ICD-10-CM

## 2017-01-14 DIAGNOSIS — B3731 Acute candidiasis of vulva and vagina: Secondary | ICD-10-CM

## 2017-01-14 DIAGNOSIS — B373 Candidiasis of vulva and vagina: Secondary | ICD-10-CM | POA: Diagnosis not present

## 2017-01-14 MED ORDER — FLUCONAZOLE 150 MG PO TABS: 150.0000 mg | ORAL_TABLET | ORAL | 0 refills | Status: DC | PRN

## 2017-01-14 MED ORDER — BUTOCONAZOLE NITRATE (1 DOSE) 2 % VA CREA: 5.0000 g | TOPICAL_CREAM | Freq: Every day | VAGINAL | 2 refills | Status: DC

## 2017-01-14 NOTE — Patient Instructions (Signed)

## 2017-01-14 NOTE — Progress Notes (Signed)
   Subjective:    Patient ID: Yvonne [name], female    DOB: July 17, 1951, 66 y.o.   MRN: [id number]  Abdominal Pain  Pertinent negatives include no dysuria or hematuria.  Vaginal Itching  The patient's primary symptoms include genital itching. The patient's pertinent negatives include no vaginal discharge. The current episode started 1 to 4 weeks ago. The problem occurs constantly. The problem has been gradually worsening. Associated symptoms include abdominal pain. Pertinent negatives include no dysuria or hematuria. She has tried antibiotics for the symptoms. The treatment provided mild relief.      Review of Systems  Gastrointestinal: Positive for abdominal pain.  Genitourinary: Negative for dysuria, hematuria and vaginal discharge.  All other systems reviewed and are negative.      Objective:   Physical Exam  Constitutional: She is oriented to person, place, and time. She appears well-developed and well-nourished. No distress.  HENT:  Head: Normocephalic.  Eyes: Pupils are equal, round, and reactive to light.  Neck: Normal range of motion. Neck supple. No thyromegaly present.  Cardiovascular: Normal rate, regular rhythm, normal heart sounds and intact distal pulses.   No murmur heard. Pulmonary/Chest: Effort normal and breath sounds normal. No respiratory distress. She has no wheezes.  Abdominal: Soft. Bowel sounds are normal. She exhibits no distension. There is no tenderness.  Musculoskeletal: Normal range of motion. She exhibits no edema or tenderness.  Negative CVA tenderness   Neurological: She is alert and oriented to person, place, and time.  Skin: Skin is warm and dry.  Psychiatric: She has a normal mood and affect. Her behavior is normal. Judgment and thought content normal.  Vitals reviewed.     BP 124/75   Pulse 68   Temp 97.9 F (36.6 C) (Oral)   Ht 5' 1.5" (1.562 m)   Wt 123 lb 6.4 oz (56 kg)   BMI 22.94 kg/m      Assessment & Plan:  1. Abdominal  pressure Urine culture pending - Urinalysis - Urine culture  2. Vagina, candidiasis Keep clean and dry Cotton underwear RTO prn - fluconazole (DIFLUCAN) 150 MG tablet; Take 1 tablet (150 mg total) by mouth every three (3) days as needed.  Dispense: 4 tablet; Refill: 0 - Butoconazole Nitrate, 1 Dose, 2 % CREA; Place 5 g vaginally at bedtime.  Dispense: 5 g; Refill: 2   Yvonne Rodney, FNP

## 2017-01-15 LAB — URINE CULTURE: Organism ID, Bacteria: NO GROWTH

## 2017-01-16 LAB — URINALYSIS
Bilirubin, UA: NEGATIVE
Glucose, UA: NEGATIVE
Ketones, UA: NEGATIVE
Leukocytes, UA: NEGATIVE
Nitrite, UA: NEGATIVE
Protein, UA: NEGATIVE
RBC, UA: NEGATIVE
Specific Gravity, UA: 1.025 (ref 1.005–1.030)
Urobilinogen, Ur: 0.2 mg/dL (ref 0.2–1.0)
pH, UA: 6.5 (ref 5.0–7.5)

## 2017-01-20 ENCOUNTER — Encounter: Payer: Self-pay | Admitting: Obstetrics & Gynecology

## 2017-01-20 ENCOUNTER — Telehealth: Payer: Self-pay | Admitting: *Deleted

## 2017-01-20 ENCOUNTER — Ambulatory Visit (INDEPENDENT_AMBULATORY_CARE_PROVIDER_SITE_OTHER): Payer: 59 | Admitting: Obstetrics & Gynecology

## 2017-01-20 VITALS — BP 144/88 | Ht 60.0 in | Wt 122.0 lb

## 2017-01-20 DIAGNOSIS — N952 Postmenopausal atrophic vaginitis: Secondary | ICD-10-CM

## 2017-01-20 DIAGNOSIS — Z1151 Encounter for screening for human papillomavirus (HPV): Secondary | ICD-10-CM

## 2017-01-20 DIAGNOSIS — R350 Frequency of micturition: Secondary | ICD-10-CM

## 2017-01-20 DIAGNOSIS — Z113 Encounter for screening for infections with a predominantly sexual mode of transmission: Secondary | ICD-10-CM | POA: Diagnosis not present

## 2017-01-20 DIAGNOSIS — Z01411 Encounter for gynecological examination (general) (routine) with abnormal findings: Secondary | ICD-10-CM

## 2017-01-20 LAB — URINALYSIS W MICROSCOPIC + REFLEX CULTURE
Bacteria, UA: NONE SEEN [HPF]
Bilirubin Urine: NEGATIVE
Casts: NONE SEEN [LPF]
Crystals: NONE SEEN [HPF]
Glucose, UA: NEGATIVE
Hgb urine dipstick: NEGATIVE
Ketones, ur: NEGATIVE
Leukocytes, UA: NEGATIVE
Nitrite: NEGATIVE
Protein, ur: NEGATIVE
RBC / HPF: NONE SEEN RBC/HPF (ref <=2)
Specific Gravity, Urine: 1.02 (ref 1.001–1.035)
WBC, UA: NONE SEEN WBC/HPF (ref <=5)
Yeast: NONE SEEN [HPF]
pH: 8.5 — ABNORMAL HIGH (ref 5.0–8.0)

## 2017-01-20 NOTE — Patient Instructions (Signed)
Your Annual exam/Gyn exam were normal with mild vaginal atrophy of Menopause.  A Pap/HPV HR was done, I'll let you know the results as soon as available.  Because of your frequent bladder infections and current symptoms, a urine test was done.  You will schedule your screening Mammo.  We will call you with an appointment for a Colonoscopy.

## 2017-01-20 NOTE — Telephone Encounter (Signed)
-----   Message from Genia Del, MD sent at 01/20/2017  9:33 AM EDT ----- Needs 10 yrs screening Colonoscopy with Dr Loreta Ave.

## 2017-01-20 NOTE — Addendum Note (Signed)
Addended by: Berna Spare A on: 01/20/2017 10:24 AM   Modules accepted: Orders

## 2017-01-20 NOTE — Progress Notes (Signed)
Yvonne [name] 03/14/1951 [id number]   History:    66 y.o. G3P2A1 Married.  Established patient for annual gyn exam.  Menopause.  No HRT, except occasional Estrace cream use.  No PMB.  No pelvic pain.  Frequent bladder infections, last 3 wks ago.  Still has urinary frequency and some burning.  No fever.  Breasts wnl.  Past medical history,surgical history, family history and social history were all reviewed and documented in the EPIC chart.  Gynecologic History No LMP recorded. Patient is postmenopausal. Contraception: post menopausal status and tubal ligation Last Pap: 2016 or 2017. Results were: normal Last mammogram: 2015. Results were: normal  Obstetric History OB History  Gravida Para Term Preterm AB Living  SAB TAB Ectopic Multiple Live Births  1            # Outcome Date GA Lbr Len/2nd Weight Sex Delivery Anes PTL Lv  3 SAB           2 Para           1 Para                ROS: A ROS was performed and pertinent positives and negatives are included in the history.  GENERAL: No fevers or chills. HEENT: No change in vision, no earache, sore throat or sinus congestion. NECK: No pain or stiffness. CARDIOVASCULAR: No chest pain or pressure. No palpitations. PULMONARY: No shortness of breath, cough or wheeze. GASTROINTESTINAL: No abdominal pain, nausea, vomiting or diarrhea, melena or bright red blood per rectum. GENITOURINARY: No urinary frequency, urgency, hesitancy or dysuria. MUSCULOSKELETAL: No joint or muscle pain, no back pain, no recent trauma. DERMATOLOGIC: No rash, no itching, no lesions. ENDOCRINE: No polyuria, polydipsia, no heat or cold intolerance. No recent change in weight. HEMATOLOGICAL: No anemia or easy bruising or bleeding. NEUROLOGIC: No headache, seizures, numbness, tingling or weakness. PSYCHIATRIC: No depression, no loss of interest in normal activity or change in sleep pattern.     Exam:   BP (!) 144/88   Ht 5' (1.524 m)   Wt 122 lb  (55.3 kg)   BMI 23.83 kg/m   Body mass index is 23.83 kg/m.  General appearance : Well developed well nourished female. No acute distress HEENT: Eyes: no retinal hemorrhage or exudates,  Neck supple, trachea midline, no carotid bruits, no thyroidmegaly Lungs: Clear to auscultation, no rhonchi or wheezes, or rib retractions  Heart: Regular rate and rhythm, no murmurs or gallops Breast:Examined in sitting and supine position were symmetrical in appearance, no palpable masses or tenderness,  no skin retraction, no nipple inversion, no nipple discharge, no skin discoloration, no axillary or supraclavicular lymphadenopathy Abdomen: no palpable masses or tenderness, no rebound or guarding Extremities: no edema or skin discoloration or tenderness  Pelvic:  Bartholin, Urethra, Skene Glands: Within normal limits             Vagina: No gross lesions or discharge  Cervix: No gross lesions or discharge.  Pap/HPV/Gono-Chlam done.  Uterus  AV, normal size, shape and consistency, non-tender and mobile  Adnexa  Without masses or tenderness  Anus and perineum  normal     Assessment/Plan:  66 y.o. female for annual exam.  1. Encounter for gynecological examination with abnormal finding Normal Annual/Gyn exam except for mild Atrophic Vaginitis.  Pap/HPV/Gono-Chlam pending.  Will schedule Mammo.  Refer for [employer].  2. Post-menopausal atrophic vaginitis Estrace cream PRN.  No need  for represcription now.  Replens OTC.  3. Urinary frequency U/A pending.  Counseling >50% x 10 min on above issues.   Genia Del MD, 9:06 AM 01/20/2017

## 2017-01-20 NOTE — Telephone Encounter (Signed)
Referral faxed to Dr.Mann office [phone] they will contact me with time and date.

## 2017-01-24 LAB — PAP IG, CT-NG NAA, HPV HIGH-RISK
Chlamydia Probe Amp: NOT DETECTED
GC Probe Amp: NOT DETECTED
HPV DNA High Risk: NOT DETECTED

## 2017-01-27 ENCOUNTER — Encounter: Payer: Self-pay | Admitting: Obstetrics & Gynecology

## 2017-01-27 ENCOUNTER — Ambulatory Visit (INDEPENDENT_AMBULATORY_CARE_PROVIDER_SITE_OTHER): Payer: 59 | Admitting: Obstetrics & Gynecology

## 2017-01-27 VITALS — BP 136/78

## 2017-01-27 DIAGNOSIS — B373 Candidiasis of vulva and vagina: Secondary | ICD-10-CM

## 2017-01-27 DIAGNOSIS — B9689 Other specified bacterial agents as the cause of diseases classified elsewhere: Secondary | ICD-10-CM | POA: Diagnosis not present

## 2017-01-27 DIAGNOSIS — N898 Other specified noninflammatory disorders of vagina: Secondary | ICD-10-CM

## 2017-01-27 DIAGNOSIS — N760 Acute vaginitis: Secondary | ICD-10-CM | POA: Diagnosis not present

## 2017-01-27 DIAGNOSIS — L298 Other pruritus: Secondary | ICD-10-CM | POA: Diagnosis not present

## 2017-01-27 DIAGNOSIS — B3731 Acute candidiasis of vulva and vagina: Secondary | ICD-10-CM

## 2017-01-27 LAB — WET PREP FOR TRICH, YEAST, CLUE: Trich, Wet Prep: NONE SEEN

## 2017-01-27 MED ORDER — TERCONAZOLE 0.8 % VA CREA: 1.0000 | TOPICAL_CREAM | Freq: Every day | VAGINAL | 0 refills | Status: AC

## 2017-01-27 MED ORDER — TINIDAZOLE 500 MG PO TABS: 2.0000 g | ORAL_TABLET | Freq: Every day | ORAL | 0 refills | Status: AC

## 2017-01-27 NOTE — Patient Instructions (Signed)
Your wet prep today showed both a Bacterial Vaginosis and a Yeast Vaginitis.  I am sending your treatment at the pharmacy.  Start with Tindamax tablets and then Terconazole cream.  To prevent recurrence, I recommend Probiotic tab or suppository vaginally every week.

## 2017-01-27 NOTE — Progress Notes (Signed)
    Yvonne [name] 02/26/1951 [id number]        66 y.o.  [id number]   RP:  Vaginal discomfort/itching  Normal Gyn exam 01/20/2017 here.  Recurrent Cystitis. No UTI Sx currently, last U. Culture neg 01/14/2017 and  U/A neg 01/20/2017. C/O Vaginal dyscomfort and itching.  No odor.  No pelvic pain.  No PMB.    Past medical history,surgical history, problem list, medications, allergies, family history and social history were all reviewed and documented in the EPIC chart.  Directed ROS with pertinent positives and negatives documented in the history of present illness/assessment and plan.  Exam:  Vitals:   01/27/17 0959  BP: 136/78   General appearance:  Normal  Gyn exam:  Vulva normal                     Speculum:  Mild increase in vaginal d/c.  Wet prep done.  Assessment/Plan:  66 y.o. [id number]  1. Vaginal itching Normal gyn exam except mild increase in vaginal d/c.  Wet Prep done.  2. Bacterial vaginosis Clue cells moderate on Wet prep.  BV present.  Tindamax treatment sent, information given to patient.  3. Yeast vaginitis Confirmed mild yeast on Wet prep.  Will treat with Terconazole cream after Tindamax treatment.  Prevention of BV and Yeast Vaginitis discussed.  Probiotic Tab/Supp vaginally qweek recommended.  Counseling >50% x 15 min on above issues.   Genia Del MD, 10:17 AM 01/27/2017

## 2017-01-30 ENCOUNTER — Telehealth: Payer: Self-pay | Admitting: *Deleted

## 2017-01-30 NOTE — Telephone Encounter (Signed)
Pt was prescribed tinidazole 2,000 mg #8  on OV 01/27/17. Pt said she took 6 pills and it makes her stomach upset has 2 pills left. Pt started the Terazol vaginal cream same day rather than starting after completing tinidazole. Please advise

## 2017-01-31 NOTE — Telephone Encounter (Signed)
Pt informed

## 2017-01-31 NOTE — Telephone Encounter (Signed)
Do not take last 2 tab of Tinidazole.  Should be enough to treat with 6 tabs.  Observe.

## 2017-01-31 NOTE — Telephone Encounter (Signed)
Pt scheduled for Dr.Mann on 07/18/17 @ 11:00am

## 2017-04-18 ENCOUNTER — Encounter: Payer: Self-pay | Admitting: Family Medicine

## 2017-04-18 ENCOUNTER — Ambulatory Visit (INDEPENDENT_AMBULATORY_CARE_PROVIDER_SITE_OTHER): Payer: 59 | Admitting: Family Medicine

## 2017-04-18 VITALS — BP 125/67 | HR 57 | Temp 98.9°F | Ht 60.0 in | Wt 125.0 lb

## 2017-04-18 DIAGNOSIS — H6121 Impacted cerumen, right ear: Secondary | ICD-10-CM

## 2017-04-18 NOTE — Progress Notes (Signed)
   HPI  Patient presents today here with right ear fullness and decreased hearing.  Patient states it's been there about one week. She denies any ear pain. She denies cough, chills, sweats. She has recently returned from a trip to sitting.  PMH: Smoking status noted ROS: Per HPI  Objective: BP 125/67   Pulse (!) 57   Temp 98.9 F (37.2 C) (Oral)   Ht 5' (1.524 m)   Wt 125 lb (56.7 kg)   BMI 24.41 kg/m  Gen: NAD, alert, cooperative with exam HEENT: NCAT, TM within normal limits, right TM obscured by cerumen, difficult to remove with curette and direct visualization CV: RRR, good S1/S2, no murmur Resp: CTABL, no wheezes, non-labored Ext: No edema, warm Neuro: Alert and oriented, No gross deficits  Assessment and plan:  # Cerumen impaction Cleaned out well today with irrigation. Symptoms resolved.    Murtis SinkSam Bradshaw, MD [facility] 04/18/2017, 3:46 PM

## 2017-04-25 ENCOUNTER — Telehealth: Payer: Self-pay | Admitting: Nurse Practitioner

## 2017-04-25 DIAGNOSIS — K219 Gastro-esophageal reflux disease without esophagitis: Secondary | ICD-10-CM

## 2017-04-26 NOTE — Telephone Encounter (Signed)
Patient's dexilant was discontinue in erroe.  She says she does not take every day but is out and [facility] needs a new script. Please advise.

## 2017-04-27 MED ORDER — DEXLANSOPRAZOLE 60 MG PO CPDR: 1.0000 | DELAYED_RELEASE_CAPSULE | Freq: Every day | ORAL | 3 refills | Status: DC

## 2017-07-18 ENCOUNTER — Encounter: Payer: Self-pay | Admitting: Obstetrics & Gynecology

## 2017-07-18 ENCOUNTER — Ambulatory Visit (INDEPENDENT_AMBULATORY_CARE_PROVIDER_SITE_OTHER): Payer: 59 | Admitting: Obstetrics & Gynecology

## 2017-07-18 VITALS — BP 124/78

## 2017-07-18 DIAGNOSIS — N898 Other specified noninflammatory disorders of vagina: Secondary | ICD-10-CM | POA: Diagnosis not present

## 2017-07-18 DIAGNOSIS — R300 Dysuria: Secondary | ICD-10-CM | POA: Diagnosis not present

## 2017-07-18 LAB — WET PREP FOR TRICH, YEAST, CLUE

## 2017-07-18 MED ORDER — FLUCONAZOLE 150 MG PO TABS: 150.0000 mg | ORAL_TABLET | Freq: Every day | ORAL | 2 refills | Status: AC

## 2017-07-18 NOTE — Progress Notes (Signed)
    Yvonne [name] [birth year] [id number]        66 y.o.  [id number]   RP:  Vaginal itching and Dysuria  HPI:  Seen end of April for a BV and Yeast Vaginitis.  Treated successfully with Tinidazole and Terconazole.  Recurrence of vaginal/vulvar itching with increased vaginal d/c.  No odor.  Using Estrace vaginal cream twice a week.  No recent treatment of Cystitis, but currently c/o pain with miction.  No urinary frequency or urgency.  No back pain.  No fever.  Past medical history,surgical history, problem list, medications, allergies, family history and social history were all reviewed and documented in the EPIC chart.  Directed ROS with pertinent positives and negatives documented in the history of present illness/assessment and plan.  Exam:  Vitals:   07/18/17 1507  BP: 124/78   General appearance:  Normal  CVAT neg bilaterally  Gyn exam:  Vulva mildly irritated.  Speculum:  Cervix/Vagina normal.  Increased vaginal d/c.  Wet prep done.  U/A negative   Assessment/Plan:  66 y.o. [id number]   1. Vaginal itching Yeast vaginitis confirmed by wet prep.  Treat with Fluconazole 150 mg 1 tab per mouth daily x 3 days.  Probiotic tablet vaginally every week for prevention as needed. - WET PREP FOR TRICH, YEAST, CLUE  2. Vaginal discharge Yeast vaginitis. - WET PREP FOR TRICH, YEAST, CLUE  3. Dysuria Negative U/A, reassured. - Urinalysis w microscopic + reflex cultur  Counseling on above issues >50% x 15 minutes  Genia Del MD, 3:31 PM 07/18/2017

## 2017-07-19 LAB — URINALYSIS W MICROSCOPIC + REFLEX CULTURE
Bacteria, UA: NONE SEEN /HPF
Bilirubin Urine: NEGATIVE
Glucose, UA: NEGATIVE
Hgb urine dipstick: NEGATIVE
Hyaline Cast: NONE SEEN /LPF
Ketones, ur: NEGATIVE
Leukocyte Esterase: NEGATIVE
Nitrites, Initial: NEGATIVE
Protein, ur: NEGATIVE
RBC / HPF: NONE SEEN /HPF (ref 0–2)
Specific Gravity, Urine: 1.015 (ref 1.001–1.03)
WBC, UA: NONE SEEN /HPF (ref 0–5)
pH: 8 (ref 5.0–8.0)

## 2017-07-19 LAB — NO CULTURE INDICATED

## 2017-07-23 NOTE — Patient Instructions (Signed)
1. Vaginal itching Yeast vaginitis confirmed by wet prep.  Treat with Fluconazole 150 mg 1 tab per mouth daily x 3 days.  Probiotic tablet vaginally every week for prevention as needed. - WET PREP FOR TRICH, YEAST, CLUE  2. Vaginal discharge Yeast vaginitis. - WET PREP FOR TRICH, YEAST, CLUE  3. Dysuria Negative U/A, reassured. - Urinalysis w microscopic + reflex cultur  Yvonne [name], it was a pleasure seeing you today!

## 2017-09-29 ENCOUNTER — Telehealth: Payer: Self-pay | Admitting: Nurse Practitioner

## 2017-09-29 NOTE — Telephone Encounter (Signed)
Patient aware had tdap in 2012

## 2017-10-03 HISTORY — PX: MOLE REMOVAL: SHX2046

## 2017-11-24 ENCOUNTER — Encounter: Payer: Self-pay | Admitting: Obstetrics & Gynecology

## 2017-11-24 ENCOUNTER — Ambulatory Visit (INDEPENDENT_AMBULATORY_CARE_PROVIDER_SITE_OTHER): Payer: 59 | Admitting: Obstetrics & Gynecology

## 2017-11-24 VITALS — BP 128/70 | Temp 99.1°F

## 2017-11-24 DIAGNOSIS — R350 Frequency of micturition: Secondary | ICD-10-CM

## 2017-11-24 DIAGNOSIS — N898 Other specified noninflammatory disorders of vagina: Secondary | ICD-10-CM

## 2017-11-24 LAB — WET PREP FOR TRICH, YEAST, CLUE

## 2017-11-24 MED ORDER — NITROFURANTOIN MONOHYD MACRO 100 MG PO CAPS: 100.0000 mg | ORAL_CAPSULE | Freq: Two times a day (BID) | ORAL | 0 refills | Status: AC

## 2017-11-24 MED ORDER — FLUCONAZOLE 150 MG PO TABS: 150.0000 mg | ORAL_TABLET | Freq: Once | ORAL | 1 refills | Status: AC

## 2017-11-24 NOTE — Progress Notes (Signed)
    Yvonne [name] 07/17/1951 [id number]        67 y.o.  [id number] Married  RP: Urinary frequency and vulvovaginal itching for 5 days  HPI: Recurrent urinary tract infections.  Patient complains of urinary frequency for 5 days but no pain with passage of urine.  No blood seen in urine.  Also has vulvar itching and some vaginal itching associated with an increase in her vaginal discharge for 5 days as well.  Using Estrace cream once a week.  Sexually active about once a week.  No fever.   OB History  Gravida Para Term Preterm AB Living  3 2     1 2   SAB TAB Ectopic Multiple Live Births  1            # Outcome Date GA Lbr Len/2nd Weight Sex Delivery Anes PTL Lv  3 SAB           2 Para           1 Para               Past medical history,surgical history, problem list, medications, allergies, family history and social history were all reviewed and documented in the EPIC chart.   Directed ROS with pertinent positives and negatives documented in the history of present illness/assessment and plan.  Exam:  Vitals:   11/24/17 1401  BP: 128/70  Temp: 99.1 F (37.3 C)  TempSrc: Oral   General appearance:  Normal  CVAT negative bilaterally  Abdomen: Normal  Gynecologic exam: Vulva normal.  Speculum: Cervix and vagina normal.  Increased vaginal secretions.  Wet prep done and Gono-Chlam  Urine analysis: Clear, nitrites negative, white blood cells 6-10, red blood cells 0-2, bacteria many.  Wet prep:  Yeasts present   Assessment/Plan:  67 y.o. G3P0012   1. Urinary frequency Probable cystitis.  Will treat with Macrobid for 7 days.  Usage reviewed.  Pending urine culture. - Urinalysis,Complete w/RFL Culture  2. Vaginal discharge Yeast vaginitis.  Will treat with fluconazole 1 tablet now and 1 tablet after finishing the antibiotics.  Will also use probiotic(lactobacilli) 1 tablet vaginally weekly as needed.  Gonorrhea and Chlamydia cultures done. - WET PREP FOR TRICH, YEAST,  CLUE - C. trachomatis/N. gonorrhoeae RNA  Other orders - fluconazole (DIFLUCAN) 150 MG tablet; Take 1 tablet (150 mg total) by mouth once for 1 dose. - nitrofurantoin, macrocrystal-monohydrate, (MACROBID) 100 MG capsule; Take 1 capsule (100 mg total) by mouth 2 (two) times daily for 7 days.  Counseling on above issues more than 50% for 15 minutes.  Genia DelMarie-Lyne Lavoie MD, 2:14 PM 11/24/2017

## 2017-11-24 NOTE — Patient Instructions (Signed)
1. Urinary frequency Probable cystitis.  Will treat with Macrobid for 7 days.  Usage reviewed.  Pending urine culture. - Urinalysis,Complete w/RFL Culture  2. Vaginal discharge Yeast vaginitis.  Will treat with fluconazole 1 tablet now and 1 tablet after finishing the antibiotics.  Will also use probiotic(lactobacilli) 1 tablet vaginally weekly as needed.  Gonorrhea and Chlamydia cultures done. - WET PREP FOR TRICH, YEAST, CLUE - C. trachomatis/N. gonorrhoeae RNA  Other orders - fluconazole (DIFLUCAN) 150 MG tablet; Take 1 tablet (150 mg total) by mouth once for 1 dose. - nitrofurantoin, macrocrystal-monohydrate, (MACROBID) 100 MG capsule; Take 1 capsule (100 mg total) by mouth 2 (two) times daily for 7 days.  Yvonne [name], it was a pleasure seeing you today!  I will inform you of your results as soon as they are available.   Vaginal Yeast infection, Adult Vaginal yeast infection is a condition that causes soreness, swelling, and redness (inflammation) of the vagina. It also causes vaginal discharge. This is a common condition. Some women get this infection frequently. What are the causes? This condition is caused by a change in the normal balance of the yeast (candida) and bacteria that live in the vagina. This change causes an overgrowth of yeast, which causes the inflammation. What increases the risk? This condition is more likely to develop in:  Women who take antibiotic medicines.  Women who have diabetes.  Women who take birth control pills.  Women who are pregnant.  Women who douche often.  Women who have a weak defense (immune) system.  Women who have been taking steroid medicines for a long time.  Women who frequently wear tight clothing.  What are the signs or symptoms? Symptoms of this condition include:  White, thick vaginal discharge.  Swelling, itching, redness, and irritation of the vagina. The lips of the vagina (vulva) may be affected as well.  Pain or a  burning feeling while urinating.  Pain during sex.  How is this diagnosed? This condition is diagnosed with a medical history and physical exam. This will include a pelvic exam. Your health care provider will examine a sample of your vaginal discharge under a microscope. Your health care provider may send this sample for testing to confirm the diagnosis. How is this treated? This condition is treated with medicine. Medicines may be over-the-counter or prescription. You may be told to use one or more of the following:  Medicine that is taken orally.  Medicine that is applied as a cream.  Medicine that is inserted directly into the vagina (suppository).  Follow these instructions at home:  Take or apply over-the-counter and prescription medicines only as told by your health care provider.  Do not have sex until your health care provider has approved. Tell your sex partner that you have a yeast infection. That person should go to his or her health care provider if he or she develops symptoms.  Do not wear tight clothes, such as pantyhose or tight pants.  Avoid using tampons until your health care provider approves.  Eat more yogurt. This may help to keep your yeast infection from returning.  Try taking a sitz bath to help with discomfort. This is a warm water bath that is taken while you are sitting down. The water should only come up to your hips and should cover your buttocks. Do this 3-4 times per day or as told by your health care provider.  Do not douche.  Wear breathable, cotton underwear.  If you have diabetes, keep  your blood sugar levels under control. Contact a health care provider if:  You have a fever.  Your symptoms go away and then return.  Your symptoms do not get better with treatment.  Your symptoms get worse.  You have new symptoms.  You develop blisters in or around your vagina.  You have blood coming from your vagina and it is not your menstrual  period.  You develop pain in your abdomen. This information is not intended to replace advice given to you by your health care provider. Make sure you discuss any questions you have with your health care provider. Document Released: 06/29/2005 Document Revised: 03/02/2016 Document Reviewed: 03/23/2015 Elsevier Interactive Patient Education  2018 [employer]  Urinary Tract Infection, Adult A urinary tract infection (UTI) is an infection of any part of the urinary tract, which includes the kidneys, ureters, bladder, and urethra. These organs make, store, and get rid of urine in the body. UTI can be a bladder infection (cystitis) or kidney infection (pyelonephritis). What are the causes? This infection may be caused by fungi, viruses, or bacteria. Bacteria are the most common cause of UTIs. This condition can also be caused by repeated incomplete emptying of the bladder during urination. What increases the risk? This condition is more likely to develop if:  You ignore your need to urinate or hold urine for long periods of time.  You do not empty your bladder completely during urination.  You wipe back to front after urinating or having a bowel movement, if you are female.  You are uncircumcised, if you are female.  You are constipated.  You have a urinary catheter that stays in place (indwelling).  You have a weak defense (immune) system.  You have a medical condition that affects your bowels, kidneys, or bladder.  You have diabetes.  You take antibiotic medicines frequently or for long periods of time, and the antibiotics no longer work well against certain types of infections (antibiotic resistance).  You take medicines that irritate your urinary tract.  You are exposed to chemicals that irritate your urinary tract.  You are female.  What are the signs or symptoms? Symptoms of this condition include:  Fever.  Frequent urination or passing small amounts of urine  frequently.  Needing to urinate urgently.  Pain or burning with urination.  Urine that smells bad or unusual.  Cloudy urine.  Pain in the lower abdomen or back.  Trouble urinating.  Blood in the urine.  Vomiting or being less hungry than normal.  Diarrhea or abdominal pain.  Vaginal discharge, if you are female.  How is this diagnosed? This condition is diagnosed with a medical history and physical exam. You will also need to provide a urine sample to test your urine. Other tests may be done, including:  Blood tests.  Sexually transmitted disease (STD) testing.  If you have had more than one UTI, a cystoscopy or imaging studies may be done to determine the cause of the infections. How is this treated? Treatment for this condition often includes a combination of two or more of the following:  Antibiotic medicine.  Other medicines to treat less common causes of UTI.  Over-the-counter medicines to treat pain.  Drinking enough water to stay hydrated.  Follow these instructions at home:  Take over-the-counter and prescription medicines only as told by your health care provider.  If you were prescribed an antibiotic, take it as told by your health care provider. Do not stop taking the antibiotic even  if you start to feel better.  Avoid alcohol, caffeine, tea, and carbonated beverages. They can irritate your bladder.  Drink enough fluid to keep your urine clear or pale yellow.  Keep all follow-up visits as told by your health care provider. This is important.  Make sure to: ? Empty your bladder often and completely. Do not hold urine for long periods of time. ? Empty your bladder before and after sex. ? Wipe from front to back after a bowel movement if you are female. Use each tissue one time when you wipe. Contact a health care provider if:  You have back pain.  You have a fever.  You feel nauseous or vomit.  Your symptoms do not get better after 3  days.  Your symptoms go away and then return. Get help right away if:  You have severe back pain or lower abdominal pain.  You are vomiting and cannot keep down any medicines or water. This information is not intended to replace advice given to you by your health care provider. Make sure you discuss any questions you have with your health care provider. Document Released: 06/29/2005 Document Revised: 03/02/2016 Document Reviewed: 08/10/2015 Elsevier Interactive Patient Education  [employer].

## 2017-11-25 LAB — C. TRACHOMATIS/N. GONORRHOEAE RNA
C. trachomatis RNA, TMA: NOT DETECTED
N. gonorrhoeae RNA, TMA: NOT DETECTED

## 2017-11-26 LAB — URINE CULTURE
MICRO NUMBER:: 90241266
Result:: NO GROWTH
SPECIMEN QUALITY:: ADEQUATE

## 2017-11-26 LAB — URINALYSIS, COMPLETE W/RFL CULTURE
Bilirubin Urine: NEGATIVE
Glucose, UA: NEGATIVE
Hyaline Cast: NONE SEEN /LPF
Ketones, ur: NEGATIVE
Nitrites, Initial: NEGATIVE
Protein, ur: NEGATIVE
Specific Gravity, Urine: 1.015 (ref 1.001–1.03)
pH: 7 (ref 5.0–8.0)

## 2017-11-26 LAB — CULTURE INDICATED

## 2017-11-27 ENCOUNTER — Telehealth: Payer: Self-pay

## 2017-11-27 NOTE — Telephone Encounter (Signed)
Patient said you recommended she use a Probiotic suppository vaginally. She asked if she can use the oral tablet and insert it vaginally?

## 2017-11-28 NOTE — Telephone Encounter (Signed)
Yes, tablets will be absorbed vaginally.

## 2017-11-28 NOTE — Telephone Encounter (Signed)
Patient informed. 

## 2018-05-25 ENCOUNTER — Other Ambulatory Visit: Payer: Self-pay

## 2018-05-30 ENCOUNTER — Telehealth: Payer: Self-pay | Admitting: *Deleted

## 2018-05-30 MED ORDER — ESTRADIOL 0.1 MG/GM VA CREA: TOPICAL_CREAM | VAGINAL | 0 refills | Status: DC

## 2018-05-30 NOTE — Telephone Encounter (Signed)
Patient is overdue for annual exam can you call her to schedule?

## 2018-05-30 NOTE — Telephone Encounter (Signed)
Left message for patient to call and schedule well women exam.  °

## 2018-06-12 NOTE — Telephone Encounter (Signed)
Patient annual exam scheduled on 09/12/18, called stating her insurance requires 3 month supply sent to pharmacy for cheaper cost. Rx sent.

## 2018-06-12 NOTE — Addendum Note (Signed)
Addended by: Aura Camps on: 06/12/2018 11:49 AM   Modules accepted: Orders

## 2018-06-20 ENCOUNTER — Other Ambulatory Visit: Payer: Self-pay

## 2018-06-20 MED ORDER — ESTRADIOL 0.1 MG/GM VA CREA: TOPICAL_CREAM | VAGINAL | 2 refills | Status: DC

## 2018-06-20 NOTE — Telephone Encounter (Signed)
Last CE was 01/20/17.  Patient is now scheduled for 09/12/18.

## 2018-07-18 ENCOUNTER — Ambulatory Visit (INDEPENDENT_AMBULATORY_CARE_PROVIDER_SITE_OTHER): Payer: 59 | Admitting: Obstetrics & Gynecology

## 2018-07-18 ENCOUNTER — Encounter: Payer: Self-pay | Admitting: Obstetrics & Gynecology

## 2018-07-18 VITALS — BP 132/70

## 2018-07-18 DIAGNOSIS — N3090 Cystitis, unspecified without hematuria: Secondary | ICD-10-CM

## 2018-07-18 DIAGNOSIS — N898 Other specified noninflammatory disorders of vagina: Secondary | ICD-10-CM

## 2018-07-18 MED ORDER — FLUCONAZOLE 150 MG PO TABS: 150.0000 mg | ORAL_TABLET | Freq: Every day | ORAL | 1 refills | Status: AC

## 2018-07-18 MED ORDER — CIPROFLOXACIN HCL 500 MG PO TABS: 500.0000 mg | ORAL_TABLET | Freq: Two times a day (BID) | ORAL | 0 refills | Status: AC

## 2018-07-18 NOTE — Progress Notes (Signed)
    Yvonne [name] 13-May-1951 [id number]        67 y.o.  [id number] Married  RP: Vaginal and vulvar itching x 1 week  HPI: Recent bladder infection treated with ABTx until a week ago.  Vaginal and vulvar itching since that time.  No Pelvic pain.  No PMB.  No Fever.   OB History  Gravida Para Term Preterm AB Living  3 2     1 2   SAB TAB Ectopic Multiple Live Births  1            # Outcome Date GA Lbr Len/2nd Weight Sex Delivery Anes PTL Lv  3 SAB           2 Para           1 Para             Past medical history,surgical history, problem list, medications, allergies, family history and social history were all reviewed and documented in the EPIC chart.   Directed ROS with pertinent positives and negatives documented in the history of present illness/assessment and plan.  Exam:  Vitals:   07/18/18 1429  BP: 132/70   General appearance:  Normal   Gynecologic exam: Vulva normal.  Speculum:  Cervix/Vagina normal.  Mild vaginal d/c.  Wet prep done.  Wet prep: Negative  U/A: Yellow cloudy, nitrites negative, white blood cells 40-60, red blood cells negative, many bacteria.  Urine culture pending.  Wet prep: Negative.   Assessment/Plan:  67 y.o. [id number]   1. Vaginal itching Wet prep negative.  Fluconazole prescribed to prevent or treat a yeast vaginitis after antibiotic treatment. - WET PREP FOR TRICH, YEAST, CLUE  2. Recurrent cystitis Probable acute cystitis.  Decision to treat with ciprofloxacin 500 mg per mouth twice a day for 7 days.  Usage reviewed and prescription sent to pharmacy. - Urinalysis,Complete w/RFL Culture  Other orders - ciprofloxacin (CIPRO) 500 MG tablet; Take 1 tablet (500 mg total) by mouth 2 (two) times daily for 7 days. - fluconazole (DIFLUCAN) 150 MG tablet; Take 1 tablet (150 mg total) by mouth daily for 3 days.  Counseling on above issues and coordination of care more than 50% for 15 minutes.  Genia Del MD, 2:35 PM  07/18/2018

## 2018-07-20 LAB — URINALYSIS, COMPLETE W/RFL CULTURE
Bilirubin Urine: NEGATIVE
Glucose, UA: NEGATIVE
Hgb urine dipstick: NEGATIVE
Hyaline Cast: NONE SEEN /LPF
Ketones, ur: NEGATIVE
Nitrites, Initial: NEGATIVE
Protein, ur: NEGATIVE
RBC / HPF: NONE SEEN /HPF (ref 0–2)
Specific Gravity, Urine: 1.01 (ref 1.001–1.03)
pH: 7 (ref 5.0–8.0)

## 2018-07-20 LAB — URINE CULTURE
MICRO NUMBER:: 91249146
Result:: NO GROWTH
SPECIMEN QUALITY:: ADEQUATE

## 2018-07-20 LAB — CULTURE INDICATED

## 2018-07-20 LAB — WET PREP FOR TRICH, YEAST, CLUE

## 2018-07-22 ENCOUNTER — Encounter: Payer: Self-pay | Admitting: Obstetrics & Gynecology

## 2018-07-22 NOTE — Patient Instructions (Signed)
1. Vaginal itching Wet prep negative.  Fluconazole prescribed to prevent or treat a yeast vaginitis after antibiotic treatment. - WET PREP FOR TRICH, YEAST, CLUE  2. Recurrent cystitis Probable acute cystitis.  Decision to treat with ciprofloxacin 500 mg per mouth twice a day for 7 days.  Usage reviewed and prescription sent to pharmacy. - Urinalysis,Complete w/RFL Culture  Other orders - ciprofloxacin (CIPRO) 500 MG tablet; Take 1 tablet (500 mg total) by mouth 2 (two) times daily for 7 days. - fluconazole (DIFLUCAN) 150 MG tablet; Take 1 tablet (150 mg total) by mouth daily for 3 days.  Yvonne [name], it was a pleasure seeing you today!  I will inform you of your results as soon as they are available.

## 2018-09-03 ENCOUNTER — Ambulatory Visit (INDEPENDENT_AMBULATORY_CARE_PROVIDER_SITE_OTHER): Payer: 59

## 2018-09-03 ENCOUNTER — Ambulatory Visit (INDEPENDENT_AMBULATORY_CARE_PROVIDER_SITE_OTHER): Payer: 59 | Admitting: Family Medicine

## 2018-09-03 ENCOUNTER — Encounter: Payer: Self-pay | Admitting: Family Medicine

## 2018-09-03 VITALS — BP 96/63 | HR 62 | Temp 98.4°F | Ht 60.0 in | Wt 129.0 lb

## 2018-09-03 DIAGNOSIS — E782 Mixed hyperlipidemia: Secondary | ICD-10-CM

## 2018-09-03 DIAGNOSIS — Z0000 Encounter for general adult medical examination without abnormal findings: Secondary | ICD-10-CM

## 2018-09-03 DIAGNOSIS — Z1231 Encounter for screening mammogram for malignant neoplasm of breast: Secondary | ICD-10-CM | POA: Diagnosis not present

## 2018-09-03 NOTE — Progress Notes (Signed)
Subjective:    Patient ID: Yvonne [name], female    DOB: 12/01/1950, 67 y.o.   MRN: [id number]  Chief Complaint:  Annual Exam   HPI: Yvonne [name] is a 67 y.o. female presenting on 09/03/2018 for Annual Exam  Pt presents today for her annual physical exam. Pt states she has been doing well overall. States she has gained some weight, but states she does not diet and exercise on a regular basis. Pt states she has been seeing her GYN for vaginal yeast infections, states she is asymptomatic at this time. States she is compliant with her dexlansoprazole and her GERD is well controlled with medications. She does have a history of hyperlipidemia. States she has controlled this with diet in the past. She denies other complaints or concerns.   Relevant past medical, surgical, family, and social history reviewed and updated as indicated.  Allergies and medications reviewed and updated.   Past Medical History:  Diagnosis Date  . Abnormal exercise tolerance test   . Anemia   . Chest pain   . Diverticulosis   . GERD (gastroesophageal reflux disease)   . Hyperlipidemia     Past Surgical History:  Procedure Laterality Date  . APPENDECTOMY    . COMBINED HYSTEROSCOPY DIAGNOSTIC / D&C  12/24/2010  . EYE SURGERY    . TUBAL LIGATION      Social History   Socioeconomic History  . Marital status: Married    Spouse name: Not on file  . Number of children: Not on file  . Years of education: Not on file  . Highest education level: Not on file  Occupational History  . Not on file  Social Needs  . Financial resource strain: Not on file  . Food insecurity:    Worry: Not on file    Inability: Not on file  . Transportation needs:    Medical: Not on file    Non-medical: Not on file  Tobacco Use  . Smoking status: Former Smoker    Last attempt to quit: 03/12/1983    Years since quitting: 35.5  . Smokeless tobacco: Never Used  . Tobacco comment: quit over 30 years ago  Substance and  Sexual Activity  . Alcohol use: Yes    Comment: rare  . Drug use: No  . Sexual activity: Yes  Lifestyle  . Physical activity:    Days per week: Not on file    Minutes per session: Not on file  . Stress: Not on file  Relationships  . Social connections:    Talks on phone: Not on file    Gets together: Not on file    Attends religious service: Not on file    Active member of club or organization: Not on file    Attends meetings of clubs or organizations: Not on file    Relationship status: Not on file  . Intimate partner violence:    Fear of current or ex partner: Not on file    Emotionally abused: Not on file    Physically abused: Not on file    Forced sexual activity: Not on file  Other Topics Concern  . Not on file  Social History Narrative  . Not on file    Outpatient Encounter Medications as of 09/03/2018  Medication Sig  . Dexlansoprazole 30 MG capsule Take 30 mg by mouth daily.  Marland Kitchen. estradiol (ESTRACE VAGINAL) 0.1 MG/GM vaginal cream Insert 1 gram weekly vaginally as needed.   No  facility-administered encounter medications on file as of 09/03/2018.     Allergies  Allergen Reactions  . Sulfa Antibiotics   . Tdap [Tetanus-Diphth-Acell Pertussis]   . Tetanus Antitoxin   . Sulfamethoxazole Itching and Rash    Eyes were red.    Review of Systems  Constitutional: Negative for activity change, appetite change, chills, fatigue and fever.  HENT: Negative for congestion, sore throat, trouble swallowing and voice change.   Eyes: Negative.   Respiratory: Negative for cough, chest tightness, shortness of breath and wheezing.   Cardiovascular: Negative for chest pain, palpitations and leg swelling.  Gastrointestinal: Negative for abdominal pain, blood in stool, constipation, diarrhea, nausea and vomiting.  Genitourinary: Negative for difficulty urinating, dysuria, flank pain, frequency, vaginal discharge and vaginal pain.  Musculoskeletal: Negative for arthralgias, gait  problem, joint swelling and myalgias.  Neurological: Negative for dizziness, syncope, weakness and headaches.  Psychiatric/Behavioral: Negative for behavioral problems and confusion.  All other systems reviewed and are negative.       Objective:    BP 96/63   Pulse 62   Temp 98.4 F (36.9 C) (Oral)   Ht 5' (1.524 m)   Wt 129 lb (58.5 kg)   BMI 25.19 kg/m    Wt Readings from Last 3 Encounters:  09/03/18 129 lb (58.5 kg)  04/18/17 125 lb (56.7 kg)  01/20/17 122 lb (55.3 kg)    Physical Exam  Constitutional: She is oriented to person, place, and time. She appears well-developed and well-nourished. She is cooperative. No distress.  HENT:  Head: Normocephalic and atraumatic.  Right Ear: Hearing, tympanic membrane, external ear and ear canal normal.  Left Ear: Hearing, tympanic membrane, external ear and ear canal normal.  Nose: Nose normal.  Mouth/Throat: Uvula is midline, oropharynx is clear and moist and mucous membranes are normal. No tonsillar exudate.  Eyes: Pupils are equal, round, and reactive to light. Conjunctivae, EOM and lids are normal.  Neck: Trachea normal, normal range of motion and phonation normal. Neck supple. No JVD present. Carotid bruit is not present. No tracheal deviation present. No thyroid mass and no thyromegaly present.  Cardiovascular: Normal rate, regular rhythm, normal heart sounds and intact distal pulses. Exam reveals no gallop and no friction rub.  No murmur heard. Pulmonary/Chest: Effort normal and breath sounds normal. No respiratory distress.  Abdominal: Soft. Bowel sounds are normal. She exhibits no distension. There is no hepatosplenomegaly. There is no tenderness.  Lymphadenopathy:    She has no cervical adenopathy.  Neurological: She is alert and oriented to person, place, and time. She has normal strength and normal reflexes. No cranial nerve deficit or sensory deficit. She displays a negative Romberg sign.  Skin: Skin is warm and dry.  Capillary refill takes less than 2 seconds.  Psychiatric: She has a normal mood and affect. Her speech is normal and behavior is normal. Judgment and thought content normal. Cognition and memory are normal.  Nursing note and vitals reviewed.   Results for orders placed or performed in visit on 07/18/18  WET PREP FOR TRICH, YEAST, CLUE  Result Value Ref Range   Source: VAGINA    RESULT    Urine Culture  Result Value Ref Range   MICRO NUMBER: [id number]    SPECIMEN QUALITY: ADEQUATE    Sample Source URINE    STATUS: FINAL    Result: No Growth   Urinalysis,Complete w/RFL Culture  Result Value Ref Range   Color, Urine YELLOW YELLOW   APPearance CLOUDY (A) CLEAR  Specific Gravity, Urine 1.010 1.001 - 1.03   pH 7.0 5.0 - 8.0   Glucose, UA NEGATIVE NEGATIVE   Bilirubin Urine NEGATIVE NEGATIVE   Ketones, ur NEGATIVE NEGATIVE   Hgb urine dipstick NEGATIVE NEGATIVE   Protein, ur NEGATIVE NEGATIVE   Nitrites, Initial NEGATIVE NEGATIVE   Leukocyte Esterase 2+ (A) NEGATIVE   WBC, UA 40-60 (A) 0 - 5 /HPF   RBC / HPF NONE SEEN 0 - 2 /HPF   Squamous Epithelial / LPF 10-20 (A) < OR = 5 /HPF   Bacteria, UA MANY (A) NONE SEEN /HPF   Hyaline Cast NONE SEEN NONE SEEN /LPF   Urine-Other     Note    REFLEXIVE URINE CULTURE  Result Value Ref Range   REFLEXIVE URINE CULTURE CULTURE INDICATED - RESULTS TO FOLLOW        Pertinent labs & imaging results that were available during my care of the patient were reviewed by me and considered in my medical decision making.  Assessment & Plan:  Yvonne [name] was seen today for annual exam.  Diagnoses and all orders for this visit:  Routine general medical examination at a health care facility -     CBC with Differential -     Comprehensive metabolic panel -     Lipid panel -     Hepatitis C antibody screen -     TSH -     MM DIAG BREAST TOMO BILATERAL; Future -     DEXA Scan  Mixed hyperlipidemia -     Lipid panel  Visit for screening  mammogram Will schedule mammogram on buses.  -     MM DIAG BREAST TOMO BILATERAL; Future  Diet and exercise encouraged.  Health Maintenance discussed. Labs pending.  Denies need for refills on medications at this time.   Continue all other maintenance medications.  Follow up plan: Return in 1 year (on 09/04/2019).  Educational handout given for female health maintenance   The above assessment and management plan was discussed with the patient. The patient verbalized understanding of and has agreed to the management plan. Patient is aware to call the clinic if symptoms persist or worsen. Patient is aware when to return to the clinic for a follow-up visit. Patient educated on when it is appropriate to go to the emergency department.   Kari Baars, FNP-C [facility] [phone]

## 2018-09-03 NOTE — Patient Instructions (Signed)

## 2018-09-04 LAB — COMPREHENSIVE METABOLIC PANEL
ALT: 14 IU/L (ref 0–32)
AST: 20 IU/L (ref 0–40)
Albumin/Globulin Ratio: 1.6 (ref 1.2–2.2)
Albumin: 4.2 g/dL (ref 3.6–4.8)
Alkaline Phosphatase: 97 IU/L (ref 39–117)
BUN/Creatinine Ratio: 24 (ref 12–28)
BUN: 16 mg/dL (ref 8–27)
Bilirubin Total: 0.2 mg/dL (ref 0.0–1.2)
CO2: 24 mmol/L (ref 20–29)
Calcium: 9.1 mg/dL (ref 8.7–10.3)
Chloride: 109 mmol/L — ABNORMAL HIGH (ref 96–106)
Creatinine, Ser: 0.67 mg/dL (ref 0.57–1.00)
GFR calc Af Amer: 105 mL/min/{1.73_m2} (ref >59)
GFR calc non Af Amer: 91 mL/min/{1.73_m2} (ref >59)
Globulin, Total: 2.6 g/dL (ref 1.5–4.5)
Glucose: 89 mg/dL (ref 65–99)
Potassium: 5.2 mmol/L (ref 3.5–5.2)
Sodium: 147 mmol/L — ABNORMAL HIGH (ref 134–144)
Total Protein: 6.8 g/dL (ref 6.0–8.5)

## 2018-09-04 LAB — CBC WITH DIFFERENTIAL/PLATELET
Basophils Absolute: 0 10*3/uL (ref 0.0–0.2)
Basos: 1 %
EOS (ABSOLUTE): 0.2 10*3/uL (ref 0.0–0.4)
Eos: 5 %
Hematocrit: 31 % — ABNORMAL LOW (ref 34.0–46.6)
Hemoglobin: 9.7 g/dL — ABNORMAL LOW (ref 11.1–15.9)
Immature Grans (Abs): 0 10*3/uL (ref 0.0–0.1)
Immature Granulocytes: 0 %
Lymphocytes Absolute: 1.6 10*3/uL (ref 0.7–3.1)
Lymphs: 38 %
MCH: 26.3 pg — ABNORMAL LOW (ref 26.6–33.0)
MCHC: 31.3 g/dL — ABNORMAL LOW (ref 31.5–35.7)
MCV: 84 fL (ref 79–97)
Monocytes Absolute: 0.4 10*3/uL (ref 0.1–0.9)
Monocytes: 8 %
Neutrophils Absolute: 2 10*3/uL (ref 1.4–7.0)
Neutrophils: 48 %
Platelets: 256 10*3/uL (ref 150–450)
RBC: 3.69 x10E6/uL — ABNORMAL LOW (ref 3.77–5.28)
RDW: 14.9 % (ref 12.3–15.4)
WBC: 4.2 10*3/uL (ref 3.4–10.8)

## 2018-09-04 LAB — LIPID PANEL
Chol/HDL Ratio: 3.8 ratio (ref 0.0–4.4)
Cholesterol, Total: 168 mg/dL (ref 100–199)
HDL: 44 mg/dL (ref >39)
LDL Calculated: 96 mg/dL (ref 0–99)
Triglycerides: 141 mg/dL (ref 0–149)
VLDL Cholesterol Cal: 28 mg/dL (ref 5–40)

## 2018-09-04 LAB — TSH: TSH: 1.71 u[IU]/mL (ref 0.450–4.500)

## 2018-09-04 LAB — HEPATITIS C ANTIBODY: Hep C Virus Ab: <0.1 s/co ratio (ref 0.0–0.9)

## 2018-09-12 ENCOUNTER — Ambulatory Visit (INDEPENDENT_AMBULATORY_CARE_PROVIDER_SITE_OTHER): Payer: 59 | Admitting: Obstetrics & Gynecology

## 2018-09-12 ENCOUNTER — Encounter: Payer: Self-pay | Admitting: Obstetrics & Gynecology

## 2018-09-12 VITALS — BP 120/78 | Ht 60.0 in | Wt 126.0 lb

## 2018-09-12 DIAGNOSIS — Z01419 Encounter for gynecological examination (general) (routine) without abnormal findings: Secondary | ICD-10-CM | POA: Diagnosis not present

## 2018-09-12 DIAGNOSIS — N3090 Cystitis, unspecified without hematuria: Secondary | ICD-10-CM | POA: Diagnosis not present

## 2018-09-12 DIAGNOSIS — Z780 Asymptomatic menopausal state: Secondary | ICD-10-CM

## 2018-09-12 DIAGNOSIS — N952 Postmenopausal atrophic vaginitis: Secondary | ICD-10-CM

## 2018-09-12 MED ORDER — NITROFURANTOIN MONOHYD MACRO 100 MG PO CAPS: 100.0000 mg | ORAL_CAPSULE | Freq: Every day | ORAL | 4 refills | Status: DC

## 2018-09-12 MED ORDER — ESTRADIOL 0.1 MG/GM VA CREA: TOPICAL_CREAM | VAGINAL | 3 refills | Status: DC

## 2018-09-12 NOTE — Progress Notes (Signed)
Yvonne [name] 03/27/1951 [id number]   History:    67 y.o. [id number] Married  RP:  Established patient presenting for annual gyn exam   HPI:   Past medical history,surgical history, family history and social history were all reviewed and documented in the EPIC chart.  Gynecologic History No LMP recorded. Patient is postmenopausal. Contraception: post menopausal status Last Pap: 01/2017. Results were: Negative/HPV HR neg Last mammogram: 2019 per patient, will obtain report Bone Density: 2019 per patient, will obtain report Colonoscopy: Will obtain report from Dr Loreta AveMann, recent per patient  Obstetric History OB History  Gravida Para Term Preterm AB Living  3 2     1 2   SAB TAB Ectopic Multiple Live Births  1            # Outcome Date GA Lbr Len/2nd Weight Sex Delivery Anes PTL Lv  3 SAB           2 Para           1 Para              ROS: A ROS was performed and pertinent positives and negatives are included in the history.  GENERAL: No fevers or chills. HEENT: No change in vision, no earache, sore throat or sinus congestion. NECK: No pain or stiffness. CARDIOVASCULAR: No chest pain or pressure. No palpitations. PULMONARY: No shortness of breath, cough or wheeze. GASTROINTESTINAL: No abdominal pain, nausea, vomiting or diarrhea, melena or bright red blood per rectum. GENITOURINARY: No urinary frequency, urgency, hesitancy or dysuria. MUSCULOSKELETAL: No joint or muscle pain, no back pain, no recent trauma. DERMATOLOGIC: No rash, no itching, no lesions. ENDOCRINE: No polyuria, polydipsia, no heat or cold intolerance. No recent change in weight. HEMATOLOGICAL: No anemia or easy bruising or bleeding. NEUROLOGIC: No headache, seizures, numbness, tingling or weakness. PSYCHIATRIC: No depression, no loss of interest in normal activity or change in sleep pattern.     Exam:   BP 120/78 (BP Location: Right Arm, Patient Position: Sitting, Cuff Size: Normal)   Ht 5' (1.524 m)   Wt 126 lb  (57.2 kg)   BMI 24.61 kg/m   Body mass index is 24.61 kg/m.  General appearance : Well developed well nourished female. No acute distress HEENT: Eyes: no retinal hemorrhage or exudates,  Neck supple, trachea midline, no carotid bruits, no thyroidmegaly Lungs: Clear to auscultation, no rhonchi or wheezes, or rib retractions  Heart: Regular rate and rhythm, no murmurs or gallops Breast:Examined in sitting and supine position were symmetrical in appearance, no palpable masses or tenderness,  no skin retraction, no nipple inversion, no nipple discharge, no skin discoloration, no axillary or supraclavicular lymphadenopathy Abdomen: no palpable masses or tenderness, no rebound or guarding Extremities: no edema or skin discoloration or tenderness  Pelvic: Vulva: Normal             Vagina: No gross lesions or discharge  Cervix: No gross lesions or discharge  Uterus  AV, normal size, shape and consistency, non-tender and mobile  Adnexa  Without masses or tenderness  Anus: Normal   Assessment/Plan:  67 y.o. female for annual exam   1. Well female exam with routine gynecological exam Normal gynecologic exam.  Pap test was negative with negative high-risk HPV in April 2018.  No indication to repeat this year.  Breast exam normal.  Will obtain report of last screening mammogram.  Health labs with family physician.  Colonoscopy done recently per patient, will obtain report.  2.  Postmenopausal Well on no hormone replacement therapy.  No postmenopausal bleeding.  Will obtain bone density report.  Recommend vitamin D supplements, calcium intake of 1.5 g/day and regular weightbearing physical activities.  3. Recurrent cystitis More than 5 episodes of cystitis per year.  Per patient urology investigation was negative.  Decision to start on nitrofurantoin 100 mg 1 tablet daily prophylaxis.  Prescription sent to pharmacy.  4. Postmenopausal atrophic vaginitis We will continue on extra dial cream 0.25 g  twice a week.  Apply a small amount at the periurethral area.  Other orders - estradiol (ESTRACE VAGINAL) 0.1 MG/GM vaginal cream; Insert 0.25 gram weekly vaginally twice a week. - nitrofurantoin, macrocrystal-monohydrate, (MACROBID) 100 MG capsule; Take 1 capsule (100 mg total) by mouth daily. Prophylaxis  Counseling on above issues and coordination of care more than 50% for 10 minutes.  Genia Del MD, 9:30 AM 09/12/2018

## 2018-09-12 NOTE — Patient Instructions (Signed)
1. Well female exam with routine gynecological exam Normal gynecologic exam.  Pap test was negative with negative high-risk HPV in April 2018.  No indication to repeat this year.  Breast exam normal.  Will obtain report of last screening mammogram.  Health labs with family physician.  Colonoscopy done recently per patient, will obtain report.  2. Postmenopausal Well on no hormone replacement therapy.  No postmenopausal bleeding.  Will obtain bone density report.  Recommend vitamin D supplements, calcium intake of 1.5 g/day and regular weightbearing physical activities.  3. Recurrent cystitis More than 5 episodes of cystitis per year.  Per patient urology investigation was negative.  Decision to start on nitrofurantoin 100 mg 1 tablet daily prophylaxis.  Prescription sent to pharmacy.  4. Postmenopausal atrophic vaginitis We will continue on extra dial cream 0.25 g twice a week.  Apply a small amount at the periurethral area.  Other orders - estradiol (ESTRACE VAGINAL) 0.1 MG/GM vaginal cream; Insert 0.25 gram weekly vaginally twice a week. - nitrofurantoin, macrocrystal-monohydrate, (MACROBID) 100 MG capsule; Take 1 capsule (100 mg total) by mouth daily. Prophylaxis  Yvonne [name], it was a pleasure seeing you today!

## 2018-09-18 LAB — HM MAMMOGRAPHY

## 2018-09-21 ENCOUNTER — Encounter: Payer: Self-pay | Admitting: Anesthesiology

## 2018-09-21 ENCOUNTER — Ambulatory Visit (INDEPENDENT_AMBULATORY_CARE_PROVIDER_SITE_OTHER): Payer: 59 | Admitting: Family Medicine

## 2018-09-21 ENCOUNTER — Encounter: Payer: Self-pay | Admitting: Family Medicine

## 2018-09-21 VITALS — BP 112/68 | HR 80 | Temp 99.9°F | Ht 60.0 in | Wt 127.0 lb

## 2018-09-21 DIAGNOSIS — D649 Anemia, unspecified: Secondary | ICD-10-CM | POA: Insufficient documentation

## 2018-09-21 DIAGNOSIS — D508 Other iron deficiency anemias: Secondary | ICD-10-CM | POA: Diagnosis not present

## 2018-09-21 DIAGNOSIS — E870 Hyperosmolality and hypernatremia: Secondary | ICD-10-CM

## 2018-09-21 NOTE — Patient Instructions (Signed)
Anemia  Anemia is a condition in which you do not have enough red blood cells or hemoglobin. Hemoglobin is a substance in red blood cells that carries oxygen. When you do not have enough red blood cells or hemoglobin (are anemic), your body cannot get enough oxygen and your organs may not work properly. As a result, you may feel very tired or have other problems. What are the causes? Common causes of anemia include:  Excessive bleeding. Anemia can be caused by excessive bleeding inside or outside the body, including bleeding from the intestine or from periods in women.  Poor nutrition.  Long-lasting (chronic) kidney, thyroid, and liver disease.  Bone marrow disorders.  Cancer and treatments for cancer.  HIV (human immunodeficiency virus) and AIDS (acquired immunodeficiency syndrome).  Treatments for HIV and AIDS.  Spleen problems.  Blood disorders.  Infections, medicines, and autoimmune disorders that destroy red blood cells. What are the signs or symptoms? Symptoms of this condition include:  Minor weakness.  Dizziness.  Headache.  Feeling heartbeats that are irregular or faster than normal (palpitations).  Shortness of breath, especially with exercise.  Paleness.  Cold sensitivity.  Indigestion.  Nausea.  Difficulty sleeping.  Difficulty concentrating. Symptoms may occur suddenly or develop slowly. If your anemia is mild, you may not have symptoms. How is this diagnosed? This condition is diagnosed based on:  Blood tests.  Your medical history.  A physical exam.  Bone marrow biopsy. Your health care provider may also check your stool (feces) for blood and may do additional testing to look for the cause of your bleeding. You may also have other tests, including:  Imaging tests, such as a CT scan or MRI.  Endoscopy.  Colonoscopy. How is this treated? Treatment for this condition depends on the cause. If you continue to lose a lot of blood, you may  need to be treated at a hospital. Treatment may include:  Taking supplements of iron, vitamin S31, or folic acid.  Taking a hormone medicine (erythropoietin) that can help to stimulate red blood cell growth.  Having a blood transfusion. This may be needed if you lose a lot of blood.  Making changes to your diet.  Having surgery to remove your spleen. Follow these instructions at home:  Take over-the-counter and prescription medicines only as told by your health care provider.  Take supplements only as told by your health care provider.  Follow any diet instructions that you were given.  Keep all follow-up visits as told by your health care provider. This is important. Contact a health care provider if:  You develop new bleeding anywhere in the body. Get help right away if:  You are very weak.  You are short of breath.  You have pain in your abdomen or chest.  You are dizzy or feel faint.  You have trouble concentrating.  You have bloody or black, tarry stools.  You vomit repeatedly or you vomit up blood. Summary  Anemia is a condition in which you do not have enough red blood cells or enough of a substance in your red blood cells that carries oxygen (hemoglobin).  Symptoms may occur suddenly or develop slowly.  If your anemia is mild, you may not have symptoms.  This condition is diagnosed with blood tests as well as a medical history and physical exam. Other tests may be needed.  Treatment for this condition depends on the cause of the anemia. This information is not intended to replace advice given to you by  your health care provider. Make sure you discuss any questions you have with your health care provider. Document Released: 10/27/2004 Document Revised: 10/21/2016 Document Reviewed: 10/21/2016 Elsevier Interactive Patient Education  2019 [employer].

## 2018-09-21 NOTE — Progress Notes (Addendum)
Subjective:    Patient ID: Yvonne [name], female    DOB: July 08, 1951, 67 y.o.   MRN: [id number]  Chief Complaint:  Discuss lab results   HPI: Yvonne [name] is a 67 y.o. female presenting on 09/21/2018 for Discuss lab results  Pt presents today for follow up of lab work. Pts last Hgb was 9.7 and Hct was 31 on 09/03/18. Pt states she has chronic anemia but is noncompliant with her iron due to constipation. She denies vaginal or rectal bleeding. Denies headaches, shortness of breath, chest pain, palpitations, or dizziness. She denies changes in her stool color. Denies fatigue or malaise. Denies cold intolerance. States she feels great. Pts sodium was noted to be elevated at 147 at last appointment, pt denies symptoms, will recheck today.   Relevant past medical, surgical, family, and social history reviewed and updated as indicated.  Allergies and medications reviewed and updated.   Past Medical History:  Diagnosis Date  . Abnormal exercise tolerance test   . Anemia   . Chest pain   . Diverticulosis   . GERD (gastroesophageal reflux disease)   . Hyperlipidemia     Past Surgical History:  Procedure Laterality Date  . APPENDECTOMY    . COMBINED HYSTEROSCOPY DIAGNOSTIC / D&C  12/24/2010  . EYE SURGERY    . MOLE REMOVAL  2019  . TUBAL LIGATION      Social History   Socioeconomic History  . Marital status: Married    Spouse name: Not on file  . Number of children: Not on file  . Years of education: Not on file  . Highest education level: Not on file  Occupational History  . Not on file  Social Needs  . Financial resource strain: Not on file  . Food insecurity:    Worry: Not on file    Inability: Not on file  . Transportation needs:    Medical: Not on file    Non-medical: Not on file  Tobacco Use  . Smoking status: Former Smoker    Last attempt to quit: 03/12/1983    Years since quitting: 35.5  . Smokeless tobacco: Never Used  . Tobacco comment: quit over 30 years  ago  Substance and Sexual Activity  . Alcohol use: Yes    Comment: rare  . Drug use: No  . Sexual activity: Yes    Birth control/protection: None    Comment: DEC[employer]INED INSURANCE QUESTIONS,DES NEG  Lifestyle  . Physical activity:    Days per week: Not on file    Minutes per session: Not on file  . Stress: Not on file  Relationships  . Social connections:    Talks on phone: Not on file    Gets together: Not on file    Attends religious service: Not on file    Active member of club or organization: Not on file    Attends meetings of clubs or organizations: Not on file    Relationship status: Not on file  . Intimate partner violence:    Fear of current or ex partner: Not on file    Emotionally abused: Not on file    Physically abused: Not on file    Forced sexual activity: Not on file  Other Topics Concern  . Not on file  Social History Narrative  . Not on file    Outpatient Encounter Medications as of 09/21/2018  Medication Sig  . Dexlansoprazole 30 MG capsule Take 30 mg by mouth daily.  Marland Kitchen  estradiol (ESTRACE VAGINAL) 0.1 MG/GM vaginal cream Insert 0.25 gram weekly vaginally twice a week.  . nitrofurantoin, macrocrystal-monohydrate, (MACROBID) 100 MG capsule Take 1 capsule (100 mg total) by mouth daily. Prophylaxis   No facility-administered encounter medications on file as of 09/21/2018.     Allergies  Allergen Reactions  . Sulfa Antibiotics   . Tdap [Tetanus-Diphth-Acell Pertussis]   . Tetanus Antitoxin   . Sulfamethoxazole Itching and Rash    Eyes were red.    Review of Systems  Constitutional: Negative for chills, fatigue and fever.  Gastrointestinal: Positive for constipation. Negative for abdominal distention, abdominal pain, anal bleeding, blood in stool, diarrhea, nausea, rectal pain and vomiting.  Genitourinary: Negative for hematuria and vaginal bleeding.  Musculoskeletal: Negative for myalgias.  Neurological: Negative for dizziness, syncope, weakness,  light-headedness and headaches.  Psychiatric/Behavioral: Negative for confusion.  All other systems reviewed and are negative.       Objective:    BP 112/68   Pulse 80   Temp 99.9 F (37.7 C) (Oral)   Ht 5' (1.524 m)   Wt 127 lb (57.6 kg)   BMI 24.80 kg/m    Wt Readings from Last 3 Encounters:  09/21/18 127 lb (57.6 kg)  09/12/18 126 lb (57.2 kg)  09/03/18 129 lb (58.5 kg)    Physical Exam Vitals signs and nursing note reviewed.  Constitutional:      General: She is not in acute distress.    Appearance: Normal appearance. She is normal weight. She is not ill-appearing.  HENT:     Head: Normocephalic and atraumatic.     Mouth/Throat:     Mouth: Mucous membranes are moist.     Pharynx: Oropharynx is clear.  Eyes:     Conjunctiva/sclera: Conjunctivae normal.     Pupils: Pupils are equal, round, and reactive to light.  Neck:     Musculoskeletal: Normal range of motion and neck supple.  Cardiovascular:     Rate and Rhythm: Normal rate and regular rhythm.     Heart sounds: Normal heart sounds. No murmur. No friction rub. No gallop.   Pulmonary:     Effort: Pulmonary effort is normal. No respiratory distress.     Breath sounds: Normal breath sounds.  Abdominal:     General: Bowel sounds are normal. There is no distension.     Palpations: Abdomen is soft.     Tenderness: There is no abdominal tenderness.  Musculoskeletal: Normal range of motion.  Skin:    General: Skin is warm and dry.     Capillary Refill: Capillary refill takes less than 2 seconds.     Coloration: Skin is not pale.  Neurological:     General: No focal deficit present.     Mental Status: She is alert and oriented to person, place, and time.  Psychiatric:        Mood and Affect: Mood normal.        Behavior: Behavior normal. Behavior is cooperative.        Thought Content: Thought content normal.        Judgment: Judgment normal.     Results for orders placed or performed in visit on 09/03/18    CBC with Differential  Result Value Ref Range   WBC 4.2 3.4 - 10.8 x10E3/uL   RBC 3.69 (L) 3.77 - 5.28 x10E6/uL   Hemoglobin 9.7 (L) 11.1 - 15.9 g/dL   Hematocrit 31.0 (L) 34.0 - 46.6 %   MCV 84 79 - 97 fL  MCH 26.3 (L) 26.6 - 33.0 pg   MCHC 31.3 (L) 31.5 - 35.7 g/dL   RDW 14.9 12.3 - 15.4 %   Platelets 256 150 - 450 x10E3/uL   Neutrophils 48 Not Estab. %   Lymphs 38 Not Estab. %   Monocytes 8 Not Estab. %   Eos 5 Not Estab. %   Basos 1 Not Estab. %   Neutrophils Absolute 2.0 1.4 - 7.0 x10E3/uL   Lymphocytes Absolute 1.6 0.7 - 3.1 x10E3/uL   Monocytes Absolute 0.4 0.1 - 0.9 x10E3/uL   EOS (ABSOLUTE) 0.2 0.0 - 0.4 x10E3/uL   Basophils Absolute 0.0 0.0 - 0.2 x10E3/uL   Immature Granulocytes 0 Not Estab. %   Immature Grans (Abs) 0.0 0.0 - 0.1 x10E3/uL  Comprehensive metabolic panel  Result Value Ref Range   Glucose 89 65 - 99 mg/dL   BUN 16 8 - 27 mg/dL   Creatinine, Ser 0.67 0.57 - 1.00 mg/dL   GFR calc non Af Amer 91 >59 mL/min/1.73   GFR calc Af Amer 105 >59 mL/min/1.73   BUN/Creatinine Ratio 24 12 - 28   Sodium 147 (H) 134 - 144 mmol/L   Potassium 5.2 3.5 - 5.2 mmol/L   Chloride 109 (H) 96 - 106 mmol/L   CO2 24 20 - 29 mmol/L   Calcium 9.1 8.7 - 10.3 mg/dL   Total Protein 6.8 6.0 - 8.5 g/dL   Albumin 4.2 3.6 - 4.8 g/dL   Globulin, Total 2.6 1.5 - 4.5 g/dL   Albumin/Globulin Ratio 1.6 1.2 - 2.2   Bilirubin Total 0.2 0.0 - 1.2 mg/dL   Alkaline Phosphatase 97 39 - 117 IU/L   AST 20 0 - 40 IU/L   ALT 14 0 - 32 IU/L  Lipid panel  Result Value Ref Range   Cholesterol, Total 168 100 - 199 mg/dL   Triglycerides 141 0 - 149 mg/dL   HDL 44 >39 mg/dL   VLDL Cholesterol Cal 28 5 - 40 mg/dL   LDL Calculated 96 0 - 99 mg/dL   Chol/HDL Ratio 3.8 0.0 - 4.4 ratio  Hepatitis C antibody screen  Result Value Ref Range   Hep C Virus Ab <0.1 0.0 - 0.9 s/co ratio  TSH  Result Value Ref Range   TSH 1.710 0.450 - 4.500 uIU/mL       Pertinent labs & imaging results that were  available during my care of the patient were reviewed by me and considered in my medical decision making.  Assessment & Plan:  Khira was seen today for discuss lab results.  Diagnoses and all orders for this visit:  Other iron deficiency anemia Pt aware of importance of compliance with iron therapy. Labs pending -     Anemia Profile B  Hypernatremia Labs pending -     BMP8+EGFR     Continue all other maintenance medications.  Follow up plan: Return in about 4 weeks (around 10/19/2018), or if symptoms worsen or fail to improve.  Educational handout given for anemia  The above assessment and management plan was discussed with the patient. The patient verbalized understanding of and has agreed to the management plan. Patient is aware to call the clinic if symptoms persist or worsen. Patient is aware when to return to the clinic for a follow-up visit. Patient educated on when it is appropriate to go to the emergency department.   Monia Pouch, FNP-C [facility] [phone]

## 2018-09-22 LAB — ANEMIA PROFILE B
Basophils Absolute: 0 10*3/uL (ref 0.0–0.2)
Basos: 1 %
EOS (ABSOLUTE): 0.1 10*3/uL (ref 0.0–0.4)
Eos: 2 %
Ferritin: 9 ng/mL — ABNORMAL LOW (ref 15–150)
Folate: 15.4 ng/mL (ref >3.0)
Hematocrit: 29.8 % — ABNORMAL LOW (ref 34.0–46.6)
Hemoglobin: 9.5 g/dL — ABNORMAL LOW (ref 11.1–15.9)
Immature Grans (Abs): 0 10*3/uL (ref 0.0–0.1)
Immature Granulocytes: 0 %
Iron Saturation: 6 % — CL (ref 15–55)
Iron: 25 ug/dL — ABNORMAL LOW (ref 27–139)
Lymphocytes Absolute: 2.2 10*3/uL (ref 0.7–3.1)
Lymphs: 41 %
MCH: 26 pg — ABNORMAL LOW (ref 26.6–33.0)
MCHC: 31.9 g/dL (ref 31.5–35.7)
MCV: 81 fL (ref 79–97)
Monocytes Absolute: 0.4 10*3/uL (ref 0.1–0.9)
Monocytes: 7 %
Neutrophils Absolute: 2.6 10*3/uL (ref 1.4–7.0)
Neutrophils: 49 %
Platelets: 259 10*3/uL (ref 150–450)
RBC: 3.66 x10E6/uL — ABNORMAL LOW (ref 3.77–5.28)
RDW: 14.8 % (ref 12.3–15.4)
Retic Ct Pct: 1.2 % (ref 0.6–2.6)
Total Iron Binding Capacity: 436 ug/dL (ref 250–450)
UIBC: 411 ug/dL — ABNORMAL HIGH (ref 118–369)
Vitamin B-12: 247 pg/mL (ref 232–1245)
WBC: 5.3 10*3/uL (ref 3.4–10.8)

## 2018-09-22 LAB — BMP8+EGFR
BUN/Creatinine Ratio: 16 (ref 12–28)
BUN: 12 mg/dL (ref 8–27)
CO2: 23 mmol/L (ref 20–29)
Calcium: 8.8 mg/dL (ref 8.7–10.3)
Chloride: 104 mmol/L (ref 96–106)
Creatinine, Ser: 0.73 mg/dL (ref 0.57–1.00)
GFR calc Af Amer: 98 mL/min/{1.73_m2} (ref >59)
GFR calc non Af Amer: 85 mL/min/{1.73_m2} (ref >59)
Glucose: 175 mg/dL — ABNORMAL HIGH (ref 65–99)
Potassium: 4.1 mmol/L (ref 3.5–5.2)
Sodium: 142 mmol/L (ref 134–144)

## 2018-09-23 ENCOUNTER — Other Ambulatory Visit: Payer: Self-pay | Admitting: Family Medicine

## 2018-09-23 DIAGNOSIS — D508 Other iron deficiency anemias: Secondary | ICD-10-CM

## 2018-09-23 DIAGNOSIS — D509 Iron deficiency anemia, unspecified: Secondary | ICD-10-CM | POA: Insufficient documentation

## 2018-09-23 DIAGNOSIS — K219 Gastro-esophageal reflux disease without esophagitis: Secondary | ICD-10-CM

## 2018-09-23 MED ORDER — FERROUS SULFATE 325 (65 FE) MG PO TBEC: 975.0000 mg | DELAYED_RELEASE_TABLET | ORAL | 3 refills | Status: DC

## 2018-10-10 ENCOUNTER — Ambulatory Visit (INDEPENDENT_AMBULATORY_CARE_PROVIDER_SITE_OTHER): Payer: BLUE CROSS/BLUE SHIELD | Admitting: Internal Medicine

## 2018-10-10 ENCOUNTER — Encounter (INDEPENDENT_AMBULATORY_CARE_PROVIDER_SITE_OTHER): Payer: Self-pay | Admitting: Internal Medicine

## 2018-10-10 ENCOUNTER — Encounter (INDEPENDENT_AMBULATORY_CARE_PROVIDER_SITE_OTHER): Payer: Self-pay | Admitting: *Deleted

## 2018-10-10 VITALS — BP 120/70 | HR 64 | Temp 98.4°F | Ht 60.0 in | Wt 127.5 lb

## 2018-10-10 DIAGNOSIS — D508 Other iron deficiency anemias: Secondary | ICD-10-CM

## 2018-10-10 DIAGNOSIS — D649 Anemia, unspecified: Secondary | ICD-10-CM | POA: Insufficient documentation

## 2018-10-10 NOTE — Patient Instructions (Signed)
The risks of bleeding, perforation and infection were reviewed with patient.  

## 2018-10-10 NOTE — Progress Notes (Signed)
Subjective:    Patient ID: Yvonne [name], female    DOB: 08/03/1951, 68 y.o.   MRN: [id number]  HPI Referred by Gilford SilviusLinda Rakes FNP for anemia.  Noted Iron and Ferritin low. Hx of anemia for over 4087yrs. . Has not been taking iron per records due to constipation.  Denies any vaginal bleeding. She says sometimes she has seen blood with her hemorrhoids. No melena.  No family hx of colon cancer. Her appetite is okay. Weight loss which was intentional. She does have some urinary symptoms. Rarely has GERD. Takes Dexilant as needed.  Her lats colonoscopy was in  October of 2018 by Dr. Loreta AveMann  (screening). Entire examined portion of the colon appeared normal. The terminal ileum appeared normal. Prominent internal hemorrhoids were noted on retroflexion.     Works at [employer]. Two children in good health. Married.      CBC Latest Ref Rng & Units 09/21/2018 09/03/2018 05/29/2015  WBC 3.4 - 10.8 x10E3/uL 5.3 4.2 5.1  Hemoglobin 11.1 - 15.9 g/dL 4.0(J9.5(L) 8.1(X9.7(L) 91.413.8  Hematocrit 34.0 - 46.6 % 29.8(L) 31.0(L) 39.9  Platelets 150 - 450 x10E3/uL 259 256 167     Iron/TIBC/Ferritin/ %Sat    Component Value Date/Time   IRON 25 (L) 09/21/2018 1453   TIBC 436 09/21/2018 1453   FERRITIN 9 (L) 09/21/2018 1453   IRONPCTSAT 6 (LL) 09/21/2018 1453       Review of Systems Past Medical History:  Diagnosis Date  . Abnormal exercise tolerance test   . Anemia   . Chest pain   . Diverticulosis   . GERD (gastroesophageal reflux disease)   . Hyperlipidemia     Past Surgical History:  Procedure Laterality Date  . APPENDECTOMY    . COMBINED HYSTEROSCOPY DIAGNOSTIC / D&C  12/24/2010  . EYE SURGERY    . MOLE REMOVAL  2019  . TUBAL LIGATION      Allergies  Allergen Reactions  . Sulfa Antibiotics     Rash and itching and eyes were red  . Tdap [Tetanus-Diphth-Acell Pertussis]   . Tetanus Antitoxin   . Sulfamethoxazole Itching and Rash    Eyes were red.    Current Outpatient Medications on  File Prior to Visit  Medication Sig Dispense Refill  . Dexlansoprazole 30 MG capsule Take 30 mg by mouth daily.    Marland Kitchen. estradiol (ESTRACE VAGINAL) 0.1 MG/GM vaginal cream Insert 0.25 gram weekly vaginally twice a week. 42.5 g 3  . nitrofurantoin, macrocrystal-monohydrate, (MACROBID) 100 MG capsule Take 1 capsule (100 mg total) by mouth daily. Prophylaxis 90 capsule 4  . ferrous sulfate 325 (65 FE) MG EC tablet Take 3 tablets (975 mg total) by mouth every other day. (Patient not taking: Reported on 10/10/2018) 90 tablet 3   No current facility-administered medications on file prior to visit.         Objective:   Physical Exam Blood pressure 120/70, pulse 64, temperature 98.4 F (36.9 C), height 5' (1.524 m), weight 127 lb 8 oz (57.8 kg). Alert and oriented. Skin warm and dry. Oral mucosa is moist.   . Sclera anicteric, conjunctivae is pink. Thyroid not enlarged. No cervical lymphadenopathy. Lungs clear. Heart regular rate and rhythm.  Abdomen is soft. Bowel sounds are positive. No hepatomegaly. No abdominal masses felt. No tenderness.  No edema to lower extremities.  Rectal exam: No masses: guaiac negative.          Assessment & Plan:  IDA. Colonoscopy up to date.  Needs EGD  to rule out PUD. She has no symptoms for IDA.  Needs to start taking the Iron.  The risks of bleeding, perforation and infection were reviewed with patient.

## 2018-10-15 ENCOUNTER — Other Ambulatory Visit: Payer: Self-pay | Admitting: Family Medicine

## 2018-10-15 DIAGNOSIS — N6489 Other specified disorders of breast: Secondary | ICD-10-CM

## 2018-10-17 ENCOUNTER — Other Ambulatory Visit: Payer: Self-pay

## 2018-10-17 ENCOUNTER — Ambulatory Visit (HOSPITAL_COMMUNITY)
Admission: RE | Admit: 2018-10-17 | Discharge: 2018-10-17 | Disposition: A | Discharge status: Home / Self Care | Payer: BLUE CROSS/BLUE SHIELD | Attending: Internal Medicine | Admitting: Internal Medicine

## 2018-10-17 ENCOUNTER — Encounter (HOSPITAL_COMMUNITY)
Admission: RE | Disposition: A | Discharge status: Home / Self Care | Payer: Self-pay | Source: Home / Self Care | Attending: Internal Medicine

## 2018-10-17 ENCOUNTER — Encounter (HOSPITAL_COMMUNITY): Payer: Self-pay | Admitting: *Deleted

## 2018-10-17 ENCOUNTER — Telehealth (INDEPENDENT_AMBULATORY_CARE_PROVIDER_SITE_OTHER): Payer: Self-pay | Admitting: *Deleted

## 2018-10-17 ENCOUNTER — Other Ambulatory Visit (INDEPENDENT_AMBULATORY_CARE_PROVIDER_SITE_OTHER): Payer: Self-pay | Admitting: *Deleted

## 2018-10-17 DIAGNOSIS — D509 Iron deficiency anemia, unspecified: Secondary | ICD-10-CM | POA: Diagnosis not present

## 2018-10-17 DIAGNOSIS — Z882 Allergy status to sulfonamides status: Secondary | ICD-10-CM | POA: Diagnosis not present

## 2018-10-17 DIAGNOSIS — K228 Other specified diseases of esophagus: Secondary | ICD-10-CM | POA: Insufficient documentation

## 2018-10-17 DIAGNOSIS — Z87891 Personal history of nicotine dependence: Secondary | ICD-10-CM | POA: Insufficient documentation

## 2018-10-17 DIAGNOSIS — K317 Polyp of stomach and duodenum: Secondary | ICD-10-CM | POA: Diagnosis not present

## 2018-10-17 DIAGNOSIS — K219 Gastro-esophageal reflux disease without esophagitis: Secondary | ICD-10-CM | POA: Diagnosis not present

## 2018-10-17 DIAGNOSIS — Z79899 Other long term (current) drug therapy: Secondary | ICD-10-CM | POA: Diagnosis not present

## 2018-10-17 DIAGNOSIS — D508 Other iron deficiency anemias: Secondary | ICD-10-CM

## 2018-10-17 DIAGNOSIS — D649 Anemia, unspecified: Secondary | ICD-10-CM | POA: Insufficient documentation

## 2018-10-17 DIAGNOSIS — E785 Hyperlipidemia, unspecified: Secondary | ICD-10-CM | POA: Insufficient documentation

## 2018-10-17 DIAGNOSIS — T18198A Other foreign object in esophagus causing other injury, initial encounter: Secondary | ICD-10-CM | POA: Diagnosis not present

## 2018-10-17 HISTORY — PX: BIOPSY: SHX5522

## 2018-10-17 HISTORY — PX: ESOPHAGOGASTRODUODENOSCOPY: SHX5428

## 2018-10-17 SURGERY — EGD (ESOPHAGOGASTRODUODENOSCOPY)
Anesthesia: Moderate Sedation

## 2018-10-17 MED ORDER — SODIUM CHLORIDE 0.9 % IV SOLN
INTRAVENOUS | Status: DC
  Administered 2018-10-17: 13:00:00 via INTRAVENOUS

## 2018-10-17 MED ORDER — MEPERIDINE HCL 50 MG/ML IJ SOLN
INTRAMUSCULAR | Status: AC
  Filled 2018-10-17: qty 1

## 2018-10-17 MED ORDER — LIDOCAINE VISCOUS HCL 2 % MT SOLN
OROMUCOSAL | Status: DC | PRN
  Administered 2018-10-17: 4 mL via OROMUCOSAL

## 2018-10-17 MED ORDER — FLINTSTONES PLUS IRON PO CHEW: 1.0000 | CHEWABLE_TABLET | Freq: Two times a day (BID) | ORAL | Status: DC

## 2018-10-17 MED ORDER — MIDAZOLAM HCL 5 MG/5ML IJ SOLN
INTRAMUSCULAR | Status: DC | PRN
  Administered 2018-10-17: 2 mg via INTRAVENOUS
  Administered 2018-10-17: 1 mg via INTRAVENOUS
  Administered 2018-10-17: 2 mg via INTRAVENOUS

## 2018-10-17 MED ORDER — MIDAZOLAM HCL 5 MG/5ML IJ SOLN
INTRAMUSCULAR | Status: AC
  Filled 2018-10-17: qty 10

## 2018-10-17 MED ORDER — STERILE WATER FOR IRRIGATION IR SOLN
Status: DC | PRN
  Administered 2018-10-17: 14:00:00

## 2018-10-17 MED ORDER — LIDOCAINE VISCOUS HCL 2 % MT SOLN
OROMUCOSAL | Status: AC
  Filled 2018-10-17: qty 15

## 2018-10-17 MED ORDER — MEPERIDINE HCL 50 MG/ML IJ SOLN
INTRAMUSCULAR | Status: DC | PRN
  Administered 2018-10-17 (×2): 25 mg via INTRAVENOUS

## 2018-10-17 NOTE — Telephone Encounter (Signed)
H&H noted and the patient will be sent a letter as a reminder.

## 2018-10-17 NOTE — Discharge Instructions (Signed)
Discontinue ferrous sulfate. [place] chewable with iron 1 tablet twice daily. Resume other medications as before. Resume usual diet. No driving for 24 hours. Physician will call with biopsy results. H/H to be checked in 1 month.  Office will remind you.     Upper Endoscopy, Adult, Care After This sheet gives you information about how to care for yourself after your procedure. Your health care provider may also give you more specific instructions. If you have problems or questions, contact your health care provider. What can I expect after the procedure? After the procedure, it is common to have:  A sore throat.  Mild stomach pain or discomfort.  Bloating.  Nausea. Follow these instructions at home:   Follow instructions from your health care provider about what to eat or drink after your procedure.  Return to your normal activities as told by your health care provider. Ask your health care provider what activities are safe for you.  Take over-the-counter and prescription medicines only as told by your health care provider.  Do not drive for 24 hours if you were given a sedative during your procedure.  Keep all follow-up visits as told by your health care provider. This is important. Contact a health care provider if you have:  A sore throat that lasts longer than one day.  Trouble swallowing. Get help right away if:  You vomit blood or your vomit looks like coffee grounds.  You have: ? A fever. ? Bloody, black, or tarry stools. ? A severe sore throat or you cannot swallow. ? Difficulty breathing. ? Severe pain in your chest or abdomen. Summary  After the procedure, it is common to have a sore throat, mild stomach discomfort, bloating, and nausea.  Do not drive for 24 hours if you were given a sedative during the procedure.  Follow instructions from your health care provider about what to eat or drink after your procedure.  Return to your normal activities  as told by your health care provider. This information is not intended to replace advice given to you by your health care provider. Make sure you discuss any questions you have with your health care provider. Document Released: 03/20/2012 Document Revised: 02/19/2018 Document Reviewed: 02/19/2018 Elsevier Interactive Patient Education  2019 [employer].

## 2018-10-17 NOTE — Op Note (Signed)
[facility] Patient Name: Yvonne [name] Procedure Date: 10/17/2018 1:55 PM MRN: [id number] Date of Birth: [birth year] Attending MD: Lionel December , MD CSN: [id number] Age: 68 Admit Type: Outpatient Procedure:                Upper GI endoscopy Indications:              Unexplained iron deficiency anemia Providers:                Lionel December, MD, Loma Messing B. Patsy Lager, RN, Sterling Big, RN Referring MD:             Sonny Masters, FNP Medicines:                Lidocaine spray, Meperidine 50 mg IV, Midazolam 5                            mg IV Complications:            No immediate complications. Estimated Blood Loss:     Estimated blood loss was minimal. Procedure:                Pre-Anesthesia Assessment:                           - Prior to the procedure, a History and Physical                            was performed, and patient medications and                            allergies were reviewed. The patient's tolerance of                            previous anesthesia was also reviewed. The risks                            and benefits of the procedure and the sedation                            options and risks were discussed with the patient.                            All questions were answered, and informed consent                            was obtained. Prior Anticoagulants: The patient has                            taken no previous anticoagulant or antiplatelet                            agents. ASA Grade Assessment: I - A normal, healthy  patient. After reviewing the risks and benefits,                            the patient was deemed in satisfactory condition to                            undergo the procedure.                           After obtaining informed consent, the endoscope was                            passed under direct vision. Throughout the                            procedure, the patient's blood  pressure, pulse, and                            oxygen saturations were monitored continuously. The                            GIF-H190 ([id number]) scope was introduced through the                            mouth, and advanced to the second part of duodenum.                            The upper GI endoscopy was accomplished without                            difficulty. The patient tolerated the procedure                            well. Scope In: 2:24:01 PM Scope Out: 2:30:46 PM Total Procedure Duration: 0 hours 6 minutes 45 seconds  Findings:      Pill was found in the mid esophagus. It was easily pushed into stomach       with scope.      The examined esophagus was normal.      The Z-line was irregular and was found 34 cm from the incisors.      Multiple 3 to 8 mm pedunculated and sessile polyps with no bleeding and       no stigmata of recent bleeding were found in the gastric fundus and in       the gastric body. Biopsies were taken with a cold forceps for histology.       The pathology specimen was placed into Bottle Number 1.      The exam of the stomach was otherwise normal.      The duodenal bulb and second portion of the duodenum were normal. Impression:               - Pill were found in the esophagus. It was pushed                            into stomach without any difficulty.                           -  Normal esophagus.                           - Z-line irregular, 34 cm from the incisors.                           - Multiple gastric polyps. Biopsied.                           - Normal duodenal bulb and second portion of the                            duodenum. Moderate Sedation:      Moderate (conscious) sedation was administered by the endoscopy nurse       and supervised by the endoscopist. The following parameters were       monitored: oxygen saturation, heart rate, blood pressure, CO2       capnography and response to care. Total physician intraservice time was        11 minutes. Recommendation:           - Patient has a contact number available for                            emergencies. The signs and symptoms of potential                            delayed complications were discussed with the                            patient. Return to normal activities tomorrow.                            Written discharge instructions were provided to the                            patient.                           - Resume previous diet today.                           - Continue present medications but stop Ferrous                            sulfate.                           - Begin flintstones chewable with iron one tablet                            bid.                           - No aspirin, ibuprofen, naproxen, or other                            non-steroidal anti-inflammatory drugs for 1  day.                           - Await pathology results.                           - Check hemoglobin and hematocrit in one month. Procedure Code(s):        --- Professional ---                           [phone], Esophagogastroduodenoscopy, flexible,                            transoral; with biopsy, single or multiple                           G0500, Moderate sedation services provided by the                            same physician or other qualified health care                            professional performing a gastrointestinal                            endoscopic service that sedation supports,                            requiring the presence of an independent trained                            observer to assist in the monitoring of the                            patient's level of consciousness and physiological                            status; initial 15 minutes of intra-service time;                            patient age 87 years or older (additional time may                            be reported with [id number], as appropriate) Diagnosis Code(s):         --- Professional ---                           [phone], Other foreign object in esophagus causing                            other injury, initial encounter                           K22.8, Other specified diseases of esophagus  K31.7, Polyp of stomach and duodenum                           D50.9, Iron deficiency anemia, unspecified CPT copyright 2018 [employer] Association. All rights reserved. The codes documented in this report are preliminary and upon coder review may  be revised to meet current compliance requirements. Lionel DecemberNajeeb Rehman, MD Lionel DecemberNajeeb Rehman, MD 10/17/2018 2:44:38 PM This report has been signed electronically. Number of Addenda: 0

## 2018-10-17 NOTE — H&P (Signed)
Yvonne [name] is an 68 y.o. female.   Chief Complaint: Patient is here for EGD. HPI: Patient is 68 year old female who was recently found to have iron deficiency anemia.  She says she used to have anemia but her hemoglobin was normal for at least 5 years.  Was normal between 2012 and 2016.  Denies melena rectal bleeding hematuria or vaginal bleeding.  She used to have heavy.'s but she has not had one in in years. She has a history of GERD.  Since she lost weight voluntarily she has noted symptomatic improvement and now taking Dexilant once a week.  She denies abdominal pain nausea or vomiting.  She does not take OTC NSAIDs.  She drinks alcohol occasionally and does not smoke cigarettes. She had screening colonoscopy in October 2018 by Dr.  Loreta Ave and it was normal. Family history is negative for CRC or celiac disease.  Past Medical History:  Diagnosis Date  . Abnormal exercise tolerance test   . Anemia   . Chest pain   . Diverticulosis   . GERD (gastroesophageal reflux disease)   . Hyperlipidemia     Past Surgical History:  Procedure Laterality Date  . APPENDECTOMY    . COMBINED HYSTEROSCOPY DIAGNOSTIC / D&C  12/24/2010  . EYE SURGERY     cosmetic to upper lids  . MOLE REMOVAL  2019  . TUBAL LIGATION      Family History  Problem Relation Age of Onset  . Hypertension Mother        possible  . Nephrolithiasis Mother   . Diabetes Brother   . Diabetes Sister    Social History:  reports that she quit smoking about 35 years ago. She has never used smokeless tobacco. She reports current alcohol use. She reports that she does not use drugs.  Allergies:  Allergies  Allergen Reactions  . Sulfa Antibiotics     Rash and itching and eyes were red  . Tdap [Tetanus-Diphth-Acell Pertussis] Swelling    Medications Prior to Admission  Medication Sig Dispense Refill  . Dexlansoprazole 30 MG capsule Take 30 mg by mouth daily as needed (reflux).     Marland Kitchen estradiol (ESTRACE VAGINAL) 0.1 MG/GM  vaginal cream Insert 0.25 gram weekly vaginally twice a week. (Patient taking differently: Place 0.25 g vaginally 2 (two) times a week. ) 42.5 g 3  . ferrous sulfate 325 (65 FE) MG EC tablet Take 3 tablets (975 mg total) by mouth every other day. (Patient taking differently: Take 650 mg by mouth every other day. ) 90 tablet 3  . nitrofurantoin, macrocrystal-monohydrate, (MACROBID) 100 MG capsule Take 1 capsule (100 mg total) by mouth daily. Prophylaxis 90 capsule 4    No results found for this or any previous visit (from the past 48 hour(s)). No results found.  ROS  Blood pressure 124/71, pulse 69, temperature 98.2 F (36.8 C), temperature source Oral, resp. rate 19, height 5' (1.524 m), weight 57.8 kg, SpO2 100 %. Physical Exam  Constitutional: She appears well-developed and well-nourished.  HENT:  Mouth/Throat: Oropharynx is clear and moist.  Eyes: Conjunctivae are normal. No scleral icterus.  Neck: No thyromegaly present.  Cardiovascular: Normal rate, regular rhythm and normal heart sounds.  No murmur heard. Respiratory: Effort normal and breath sounds normal.  GI: Soft. She exhibits no distension and no mass. There is no abdominal tenderness.  Lymphadenopathy:    She has no cervical adenopathy.  Neurological: She is alert.  Skin: Skin is warm and dry.  Assessment/Plan Iron deficiency anemia. History of GERD. Normal colonoscopy in October 2018. Diagnostic EGD.  Lionel December, MD 10/17/2018, 2:13 PM

## 2018-10-17 NOTE — Telephone Encounter (Signed)
Patient needs H&H in 1 month

## 2018-10-22 ENCOUNTER — Encounter (HOSPITAL_COMMUNITY): Payer: Self-pay | Admitting: Internal Medicine

## 2018-10-23 ENCOUNTER — Other Ambulatory Visit (INDEPENDENT_AMBULATORY_CARE_PROVIDER_SITE_OTHER): Payer: Self-pay | Admitting: *Deleted

## 2018-10-23 DIAGNOSIS — D508 Other iron deficiency anemias: Secondary | ICD-10-CM

## 2018-10-29 ENCOUNTER — Encounter (INDEPENDENT_AMBULATORY_CARE_PROVIDER_SITE_OTHER): Payer: Self-pay | Admitting: *Deleted

## 2018-10-29 ENCOUNTER — Other Ambulatory Visit (INDEPENDENT_AMBULATORY_CARE_PROVIDER_SITE_OTHER): Payer: Self-pay | Admitting: *Deleted

## 2018-10-29 DIAGNOSIS — D508 Other iron deficiency anemias: Secondary | ICD-10-CM

## 2018-10-29 DIAGNOSIS — D509 Iron deficiency anemia, unspecified: Secondary | ICD-10-CM

## 2018-10-30 ENCOUNTER — Ambulatory Visit (HOSPITAL_COMMUNITY)
Admission: RE | Admit: 2018-10-30 | Discharge: 2018-10-30 | Disposition: A | Discharge status: Home / Self Care | Payer: BLUE CROSS/BLUE SHIELD | Source: Ambulatory Visit | Attending: Family Medicine | Admitting: Family Medicine

## 2018-10-30 DIAGNOSIS — N6489 Other specified disorders of breast: Secondary | ICD-10-CM

## 2018-10-30 DIAGNOSIS — N6321 Unspecified lump in the left breast, upper outer quadrant: Secondary | ICD-10-CM | POA: Diagnosis not present

## 2018-10-30 DIAGNOSIS — R922 Inconclusive mammogram: Secondary | ICD-10-CM | POA: Diagnosis not present

## 2018-11-16 DIAGNOSIS — D509 Iron deficiency anemia, unspecified: Secondary | ICD-10-CM | POA: Diagnosis not present

## 2018-11-16 LAB — HEMOGLOBIN AND HEMATOCRIT, BLOOD
HCT: 33 % — ABNORMAL LOW (ref 35.0–45.0)
Hemoglobin: 10.9 g/dL — ABNORMAL LOW (ref 11.7–15.5)

## 2018-11-19 ENCOUNTER — Other Ambulatory Visit (INDEPENDENT_AMBULATORY_CARE_PROVIDER_SITE_OTHER): Payer: Self-pay | Admitting: *Deleted

## 2018-11-19 DIAGNOSIS — D509 Iron deficiency anemia, unspecified: Secondary | ICD-10-CM

## 2018-11-28 ENCOUNTER — Encounter (INDEPENDENT_AMBULATORY_CARE_PROVIDER_SITE_OTHER): Payer: Self-pay | Admitting: *Deleted

## 2018-11-28 ENCOUNTER — Other Ambulatory Visit (INDEPENDENT_AMBULATORY_CARE_PROVIDER_SITE_OTHER): Payer: Self-pay | Admitting: *Deleted

## 2018-11-28 DIAGNOSIS — D509 Iron deficiency anemia, unspecified: Secondary | ICD-10-CM

## 2018-12-17 DIAGNOSIS — D509 Iron deficiency anemia, unspecified: Secondary | ICD-10-CM | POA: Diagnosis not present

## 2018-12-17 LAB — HEMOGLOBIN AND HEMATOCRIT, BLOOD
HCT: 37 % (ref 35.0–45.0)
Hemoglobin: 12.5 g/dL (ref 11.7–15.5)

## 2018-12-20 ENCOUNTER — Other Ambulatory Visit (INDEPENDENT_AMBULATORY_CARE_PROVIDER_SITE_OTHER): Payer: Self-pay | Admitting: *Deleted

## 2018-12-20 DIAGNOSIS — D509 Iron deficiency anemia, unspecified: Secondary | ICD-10-CM

## 2018-12-31 ENCOUNTER — Other Ambulatory Visit: Payer: Self-pay | Admitting: Nurse Practitioner

## 2019-02-20 ENCOUNTER — Encounter (INDEPENDENT_AMBULATORY_CARE_PROVIDER_SITE_OTHER): Payer: Self-pay | Admitting: *Deleted

## 2019-02-20 ENCOUNTER — Other Ambulatory Visit (INDEPENDENT_AMBULATORY_CARE_PROVIDER_SITE_OTHER): Payer: Self-pay | Admitting: *Deleted

## 2019-02-20 DIAGNOSIS — D509 Iron deficiency anemia, unspecified: Secondary | ICD-10-CM

## 2019-03-22 DIAGNOSIS — D509 Iron deficiency anemia, unspecified: Secondary | ICD-10-CM | POA: Diagnosis not present

## 2019-03-22 LAB — CBC
HCT: 38.1 % (ref 35.0–45.0)
Hemoglobin: 13 g/dL (ref 11.7–15.5)
MCH: 31.8 pg (ref 27.0–33.0)
MCHC: 34.1 g/dL (ref 32.0–36.0)
MCV: 93.2 fL (ref 80.0–100.0)
MPV: 10.9 fL (ref 7.5–12.5)
Platelets: 237 10*3/uL (ref 140–400)
RBC: 4.09 10*6/uL (ref 3.80–5.10)
RDW: 13.3 % (ref 11.0–15.0)
WBC: 4.7 10*3/uL (ref 3.8–10.8)

## 2019-03-22 LAB — FERRITIN: Ferritin: 26 ng/mL (ref 16–288)

## 2019-03-22 LAB — IRON, TOTAL/TOTAL IRON BINDING CAP
%SAT: 33 % (calc) (ref 16–45)
Iron: 140 ug/dL (ref 45–160)
TIBC: 418 mcg/dL (calc) (ref 250–450)

## 2019-03-25 ENCOUNTER — Other Ambulatory Visit (INDEPENDENT_AMBULATORY_CARE_PROVIDER_SITE_OTHER): Payer: Self-pay | Admitting: *Deleted

## 2019-03-25 DIAGNOSIS — D509 Iron deficiency anemia, unspecified: Secondary | ICD-10-CM

## 2019-03-25 NOTE — Progress Notes (Signed)
cbc

## 2019-04-05 ENCOUNTER — Other Ambulatory Visit: Payer: Self-pay | Admitting: Family Medicine

## 2019-05-27 ENCOUNTER — Other Ambulatory Visit (INDEPENDENT_AMBULATORY_CARE_PROVIDER_SITE_OTHER): Payer: Self-pay | Admitting: *Deleted

## 2019-05-27 DIAGNOSIS — D509 Iron deficiency anemia, unspecified: Secondary | ICD-10-CM

## 2019-06-28 DIAGNOSIS — D509 Iron deficiency anemia, unspecified: Secondary | ICD-10-CM | POA: Diagnosis not present

## 2019-06-28 LAB — CBC
HCT: 26.7 % — ABNORMAL LOW (ref 35.0–45.0)
Hemoglobin: 8.6 g/dL — ABNORMAL LOW (ref 11.7–15.5)
MCH: 29 pg (ref 27.0–33.0)
MCHC: 32.2 g/dL (ref 32.0–36.0)
MCV: 89.9 fL (ref 80.0–100.0)
MPV: 10.7 fL (ref 7.5–12.5)
Platelets: 270 10*3/uL (ref 140–400)
RBC: 2.97 10*6/uL — ABNORMAL LOW (ref 3.80–5.10)
RDW: 12.6 % (ref 11.0–15.0)
WBC: 4.5 10*3/uL (ref 3.8–10.8)

## 2019-06-29 ENCOUNTER — Other Ambulatory Visit (INDEPENDENT_AMBULATORY_CARE_PROVIDER_SITE_OTHER): Payer: Self-pay | Admitting: Internal Medicine

## 2019-06-29 DIAGNOSIS — D508 Other iron deficiency anemias: Secondary | ICD-10-CM

## 2019-07-01 ENCOUNTER — Other Ambulatory Visit (INDEPENDENT_AMBULATORY_CARE_PROVIDER_SITE_OTHER): Payer: Self-pay | Admitting: *Deleted

## 2019-07-01 DIAGNOSIS — D508 Other iron deficiency anemias: Secondary | ICD-10-CM

## 2019-07-01 LAB — CBC
HCT: 29.2 % — ABNORMAL LOW (ref 35.0–45.0)
Hemoglobin: 9.4 g/dL — ABNORMAL LOW (ref 11.7–15.5)
MCH: 28.9 pg (ref 27.0–33.0)
MCHC: 32.2 g/dL (ref 32.0–36.0)
MCV: 89.8 fL (ref 80.0–100.0)
MPV: 10.6 fL (ref 7.5–12.5)
Platelets: 273 10*3/uL (ref 140–400)
RBC: 3.25 10*6/uL — ABNORMAL LOW (ref 3.80–5.10)
RDW: 12.7 % (ref 11.0–15.0)
WBC: 4.1 10*3/uL (ref 3.8–10.8)

## 2019-07-12 ENCOUNTER — Other Ambulatory Visit: Payer: Self-pay | Admitting: Family Medicine

## 2019-07-24 DIAGNOSIS — H40012 Open angle with borderline findings, low risk, left eye: Secondary | ICD-10-CM | POA: Diagnosis not present

## 2019-07-24 DIAGNOSIS — H52203 Unspecified astigmatism, bilateral: Secondary | ICD-10-CM | POA: Diagnosis not present

## 2019-07-24 DIAGNOSIS — H2513 Age-related nuclear cataract, bilateral: Secondary | ICD-10-CM | POA: Diagnosis not present

## 2019-08-01 DIAGNOSIS — L218 Other seborrheic dermatitis: Secondary | ICD-10-CM | POA: Diagnosis not present

## 2019-08-01 DIAGNOSIS — D2239 Melanocytic nevi of other parts of face: Secondary | ICD-10-CM | POA: Diagnosis not present

## 2019-08-01 DIAGNOSIS — L298 Other pruritus: Secondary | ICD-10-CM | POA: Diagnosis not present

## 2019-08-12 ENCOUNTER — Other Ambulatory Visit: Payer: Self-pay | Admitting: Family Medicine

## 2019-08-12 ENCOUNTER — Other Ambulatory Visit: Payer: Self-pay

## 2019-08-15 ENCOUNTER — Other Ambulatory Visit: Payer: Self-pay

## 2019-08-16 ENCOUNTER — Encounter: Payer: Self-pay | Admitting: Family Medicine

## 2019-08-16 ENCOUNTER — Ambulatory Visit: Payer: 59 | Admitting: Family Medicine

## 2019-08-16 ENCOUNTER — Other Ambulatory Visit: Payer: Self-pay

## 2019-08-16 VITALS — BP 132/67 | HR 54 | Temp 98.6°F | Resp 20 | Ht 60.0 in | Wt 125.0 lb

## 2019-08-16 DIAGNOSIS — K219 Gastro-esophageal reflux disease without esophagitis: Secondary | ICD-10-CM | POA: Diagnosis not present

## 2019-08-16 DIAGNOSIS — K047 Periapical abscess without sinus: Secondary | ICD-10-CM

## 2019-08-16 DIAGNOSIS — K644 Residual hemorrhoidal skin tags: Secondary | ICD-10-CM | POA: Diagnosis not present

## 2019-08-16 DIAGNOSIS — E782 Mixed hyperlipidemia: Secondary | ICD-10-CM

## 2019-08-16 DIAGNOSIS — E870 Hyperosmolality and hypernatremia: Secondary | ICD-10-CM

## 2019-08-16 DIAGNOSIS — D649 Anemia, unspecified: Secondary | ICD-10-CM

## 2019-08-16 MED ORDER — DEXILANT 60 MG PO CPDR: 60.0000 mg | DELAYED_RELEASE_CAPSULE | Freq: Every day | ORAL | 3 refills | Status: DC

## 2019-08-16 MED ORDER — AMOXICILLIN 875 MG PO TABS: 875.0000 mg | ORAL_TABLET | Freq: Two times a day (BID) | ORAL | 0 refills | Status: DC

## 2019-08-16 NOTE — Progress Notes (Signed)
Subjective:  Patient ID: Yvonne [name], female    DOB: [birth year], 68 y.o.   MRN: [id number]  Patient Care Team: Baruch Gouty, FNP as PCP - General (Family Medicine)   Chief Complaint:  Medical Management of Chronic Issues, Gastroesophageal Reflux, and Dental Pain (left back -top )   HPI: Yvonne [name] is a 68 y.o. female presenting on 08/16/2019 for Medical Management of Chronic Issues, Gastroesophageal Reflux, and Dental Pain (left back -top )  1. Gastroesophageal reflux disease without esophagitis Compliant with medications - Yes Current medications - Dexilant Adverse side effects - No Cough - No Sore throat - No Voice change - No Hemoptysis - No Dysphagia or dyspepsia - No Water brash - No Red Flags (weight loss, hematochezia, melena, weight loss, early satiety, fevers, odynophagia, or persistent vomiting) - No   2. Mixed hyperlipidemia Diet controlled. Does watch what she eats and is active on a regular basis.   Lab Results  Component Value Date   CHOL 168 09/03/2018   HDL 44 09/03/2018   LDLCALC 96 09/03/2018   TRIG 141 09/03/2018   CHOLHDL 3.8 09/03/2018     Family and personal medical history reviewed. Smoking and ETOH history reviewed.    3. Chronic anemia On iron repletion therapy and is followed by GI. No weakness, fatigue, or dizziness. Does report bleeding hemorrhoids at times.   4. External hemorrhoid External hemorrhoids that bleed at times. Bright red blood after having a BM. Followed by GI, to discuss at next visit.   5. Hypernatremia Last NA level elevated. Pt asymptomatic and has cut out sodium in diet.   6. Dental abscess Pt c/o left upper molar pain with gum irritation. States she has an appointment with her dentist next week but would like to get something for the pain and irritation. No fever, chills, or gum bleeding.      Relevant past medical, surgical, family, and social history reviewed and updated as indicated.  Allergies and  medications reviewed and updated. Date reviewed: Chart in Epic.   Past Medical History:  Diagnosis Date  . Abnormal exercise tolerance test   . Anemia   . Chest pain   . Diverticulosis   . GERD (gastroesophageal reflux disease)   . Hyperlipidemia     Past Surgical History:  Procedure Laterality Date  . APPENDECTOMY    . BIOPSY  10/17/2018   Procedure: BIOPSY;  Surgeon: Rogene Houston, MD;  Location: [facility];  Service: Endoscopy;;  gastric   . COMBINED HYSTEROSCOPY DIAGNOSTIC / D&C  12/24/2010  . ESOPHAGOGASTRODUODENOSCOPY N/A 10/17/2018   Procedure: ESOPHAGOGASTRODUODENOSCOPY (EGD);  Surgeon: Rogene Houston, MD;  Location: [facility];  Service: Endoscopy;  Laterality: N/A;  2:55  . EYE SURGERY     cosmetic to upper lids  . MOLE REMOVAL  2019  . TUBAL LIGATION      Social History   Socioeconomic History  . Marital status: Married    Spouse name: Not on file  . Number of children: Not on file  . Years of education: Not on file  . Highest education level: Not on file  Occupational History  . Not on file  Social Needs  . Financial resource strain: Not on file  . Food insecurity    Worry: Not on file    Inability: Not on file  . Transportation needs    Medical: Not on file    Non-medical: Not on file  Tobacco Use  .  Smoking status: Former Smoker    Quit date: 03/12/1983    Years since quitting: 36.4  . Smokeless tobacco: Never Used  . Tobacco comment: quit over 30 years ago  Substance and Sexual Activity  . Alcohol use: Yes    Comment: rare beer  . Drug use: No  . Sexual activity: Yes    Birth control/protection: None    Comment: DECLEINED INSURANCE QUESTIONS,DES NEG  Lifestyle  . Physical activity    Days per week: Not on file    Minutes per session: Not on file  . Stress: Not on file  Relationships  . Social Herbalist on phone: Not on file    Gets together: Not on file    Attends religious service: Not on file    Active member of  club or organization: Not on file    Attends meetings of clubs or organizations: Not on file    Relationship status: Not on file  . Intimate partner violence    Fear of current or ex partner: Not on file    Emotionally abused: Not on file    Physically abused: Not on file    Forced sexual activity: Not on file  Other Topics Concern  . Not on file  Social History Narrative  . Not on file    Outpatient Encounter Medications as of 08/16/2019  Medication Sig  . dexlansoprazole (DEXILANT) 60 MG capsule Take 1 capsule (60 mg total) by mouth daily.  . nitrofurantoin, macrocrystal-monohydrate, (MACROBID) 100 MG capsule Take 1 capsule (100 mg total) by mouth daily. Prophylaxis  . Pediatric Multivitamins-Iron (FLINTSTONES PLUS IRON) chewable tablet Chew 1 tablet by mouth 2 (two) times daily.  . [DISCONTINUED] DEXILANT 60 MG capsule TAKE 1 CAPSULE BY MOUTH DAILY (PATIENT NEEDS TO BE SEEN )  . amoxicillin (AMOXIL) 875 MG tablet Take 1 tablet (875 mg total) by mouth 2 (two) times daily. 1 po BID  . estradiol (ESTRACE VAGINAL) 0.1 MG/GM vaginal cream Insert 0.25 gram weekly vaginally twice a week. (Patient not taking: Reported on 08/16/2019)  . [DISCONTINUED] ferrous sulfate 325 (65 FE) MG EC tablet Take 3 tablets (975 mg total) by mouth every other day. (Patient taking differently: Take 650 mg by mouth every other day. )   No facility-administered encounter medications on file as of 08/16/2019.     Allergies  Allergen Reactions  . Sulfa Antibiotics     Rash and itching and eyes were red  . Tdap [Tetanus-Diphth-Acell Pertussis] Swelling    Review of Systems  Constitutional: Negative for activity change, appetite change, chills, diaphoresis, fatigue, fever and unexpected weight change.  HENT: Positive for dental problem.   Eyes: Negative.  Negative for photophobia and visual disturbance.  Respiratory: Negative for cough, chest tightness and shortness of breath.   Cardiovascular: Negative for  chest pain, palpitations and leg swelling.  Gastrointestinal: Positive for anal bleeding (bright red with bowel movements). Negative for abdominal pain, blood in stool, constipation, diarrhea, nausea and vomiting.  Endocrine: Negative.  Negative for polydipsia, polyphagia and polyuria.  Genitourinary: Negative for decreased urine volume, difficulty urinating, dysuria, frequency and urgency.  Musculoskeletal: Negative for arthralgias and myalgias.  Skin: Negative.  Negative for pallor.  Allergic/Immunologic: Negative.   Neurological: Negative for dizziness, seizures, syncope, facial asymmetry, speech difficulty, weakness, light-headedness, numbness and headaches.  Hematological: Negative.   Psychiatric/Behavioral: Negative for confusion, hallucinations, sleep disturbance and suicidal ideas.  All other systems reviewed and are negative.  Objective:  BP 132/67   Pulse (!) 54   Temp 98.6 F (37 C)   Resp 20   Ht 5' (1.524 m)   Wt 125 lb (56.7 kg)   SpO2 100%   BMI 24.41 kg/m    Wt Readings from Last 3 Encounters:  08/16/19 125 lb (56.7 kg)  10/17/18 127 lb 8 oz (57.8 kg)  10/10/18 127 lb 8 oz (57.8 kg)    Physical Exam Vitals signs and nursing note reviewed.  Constitutional:      General: She is not in acute distress.    Appearance: Normal appearance. She is well-developed and well-groomed. She is not ill-appearing, toxic-appearing or diaphoretic.  HENT:     Head: Normocephalic and atraumatic.     Jaw: There is normal jaw occlusion.     Right Ear: Hearing normal.     Left Ear: Hearing normal.     Nose: Nose normal.     Mouth/Throat:     Lips: Pink.     Mouth: Mucous membranes are moist.     Dentition: Abnormal dentition. Dental tenderness, gingival swelling and dental caries present.     Pharynx: Oropharynx is clear. Uvula midline.   Eyes:     General: Lids are normal.     Extraocular Movements: Extraocular movements intact.     Conjunctiva/sclera: Conjunctivae  normal.     Pupils: Pupils are equal, round, and reactive to light.  Neck:     Musculoskeletal: Normal range of motion and neck supple.     Thyroid: No thyroid mass, thyromegaly or thyroid tenderness.     Vascular: No carotid bruit or JVD.     Trachea: Trachea and phonation normal.  Cardiovascular:     Rate and Rhythm: Normal rate and regular rhythm.     Chest Wall: PMI is not displaced.     Pulses: Normal pulses.     Heart sounds: Normal heart sounds. No murmur. No friction rub. No gallop.   Pulmonary:     Effort: Pulmonary effort is normal. No respiratory distress.     Breath sounds: Normal breath sounds. No wheezing.  Abdominal:     General: Bowel sounds are normal. There is no distension or abdominal bruit.     Palpations: Abdomen is soft. There is no hepatomegaly or splenomegaly.     Tenderness: There is no abdominal tenderness. There is no right CVA tenderness or left CVA tenderness.     Hernia: No hernia is present.  Musculoskeletal: Normal range of motion.     Right lower leg: No edema.     Left lower leg: No edema.  Lymphadenopathy:     Cervical: No cervical adenopathy.  Skin:    General: Skin is warm and dry.     Capillary Refill: Capillary refill takes less than 2 seconds.     Coloration: Skin is not cyanotic, jaundiced or pale.     Findings: No rash.  Neurological:     General: No focal deficit present.     Mental Status: She is alert and oriented to person, place, and time.     Cranial Nerves: Cranial nerves are intact. No cranial nerve deficit.     Sensory: Sensation is intact. No sensory deficit.     Motor: Motor function is intact. No weakness.     Coordination: Coordination is intact. Coordination normal.     Gait: Gait is intact. Gait normal.     Deep Tendon Reflexes: Reflexes are normal and symmetric. Reflexes normal.  Psychiatric:  Attention and Perception: Attention and perception normal.        Mood and Affect: Mood and affect normal.         Speech: Speech normal.        Behavior: Behavior normal. Behavior is cooperative.        Thought Content: Thought content normal.        Cognition and Memory: Cognition and memory normal.        Judgment: Judgment normal.     Results for orders placed or performed in visit on 07/01/19  CBC  Result Value Ref Range   WBC 4.1 3.8 - 10.8 Thousand/uL   RBC 3.25 (L) 3.80 - 5.10 Million/uL   Hemoglobin 9.4 (L) 11.7 - 15.5 g/dL   HCT 29.2 (L) 35.0 - 45.0 %   MCV 89.8 80.0 - 100.0 fL   MCH 28.9 27.0 - 33.0 pg   MCHC 32.2 32.0 - 36.0 g/dL   RDW 12.7 11.0 - 15.0 %   Platelets 273 140 - 400 Thousand/uL   MPV 10.6 7.5 - 12.5 fL       Pertinent labs & imaging results that were available during my care of the patient were reviewed by me and considered in my medical decision making.  Assessment & Plan:  Evoleth was seen today for medical management of chronic issues, gastroesophageal reflux and dental pain.  Diagnoses and all orders for this visit:  Gastroesophageal reflux disease without esophagitis Is followed by GI. Diet discussed. Avoid fried, spicy, fatty, greasy, and acidic foods. Avoid caffeine, nicotine, and alcohol. Do not eat 2-3 hours before bedtime and stay upright for at least 1-2 hours after eating. Eat small frequent meals. Avoid NSAID's like motrin and aleve. Medications as prescribed. Report any new or worsening symptoms. Follow up as discussed or sooner if needed.   -     CMP14+EGFR -     Anemia Profile with CBC -     dexlansoprazole (DEXILANT) 60 MG capsule; Take 1 capsule (60 mg total) by mouth daily.  Mixed hyperlipidemia Will recheck today. Diet and exercise encouraged. Labs pending.  -     Lipid panel  Chronic anemia On iron repletion and followed by GI. Will recheck today.  -     Anemia Profile with CBC  External hemorrhoid Has follow up scheduled with GI, will discuss at upcoming appointment.   Hypernatremia Will recheck today.  -     CMP14+EGFR  Dental  abscess Left upper molar. Has dental appointment next week. Gum is inflamed and tender. Will initiate amoxicillin today. Keep appointment with dentist.  -     amoxicillin (AMOXIL) 875 MG tablet; Take 1 tablet (875 mg total) by mouth 2 (two) times daily. 1 po BID     Continue all other maintenance medications.  Follow up plan: Return if symptoms worsen or fail to improve, for CPE.  Continue healthy lifestyle choices, including diet (rich in fruits, vegetables, and lean proteins, and low in salt and simple carbohydrates) and exercise (at least 30 minutes of moderate physical activity daily).  Educational handout given for health maintenance.   The above assessment and management plan was discussed with the patient. The patient verbalized understanding of and has agreed to the management plan. Patient is aware to call the clinic if they develop any new symptoms or if symptoms persist or worsen. Patient is aware when to return to the clinic for a follow-up visit. Patient educated on when it is appropriate to go to the  emergency department.   Monia Pouch, FNP-C [facility] [phone]

## 2019-08-16 NOTE — Patient Instructions (Signed)
Health Maintenance After Age 69 After age 69, you are at a higher risk for certain long-term diseases and infections as well as injuries from falls. Falls are a major cause of broken bones and head injuries in people who are older than age 69. Getting regular preventive care can help to keep you healthy and well. Preventive care includes getting regular testing and making lifestyle changes as recommended by your health care provider. Talk with your health care provider about:  Which screenings and tests you should have. A screening is a test that checks for a disease when you have no symptoms.  A diet and exercise plan that is right for you. What should I know about screenings and tests to prevent falls? Screening and testing are the best ways to find a health problem early. Early diagnosis and treatment give you the best chance of managing medical conditions that are common after age 69. Certain conditions and lifestyle choices may make you more likely to have a fall. Your health care provider may recommend:  Regular vision checks. Poor vision and conditions such as cataracts can make you more likely to have a fall. If you wear glasses, make sure to get your prescription updated if your vision changes.  Medicine review. Work with your health care provider to regularly review all of the medicines you are taking, including over-the-counter medicines. Ask your health care provider about any side effects that may make you more likely to have a fall. Tell your health care provider if any medicines that you take make you feel dizzy or sleepy.  Osteoporosis screening. Osteoporosis is a condition that causes the bones to get weaker. This can make the bones weak and cause them to break more easily.  Blood pressure screening. Blood pressure changes and medicines to control blood pressure can make you feel dizzy.  Strength and balance checks. Your health care provider may recommend certain tests to check your  strength and balance while standing, walking, or changing positions.  Foot health exam. Foot pain and numbness, as well as not wearing proper footwear, can make you more likely to have a fall.  Depression screening. You may be more likely to have a fall if you have a fear of falling, feel emotionally low, or feel unable to do activities that you used to do.  Alcohol use screening. Using too much alcohol can affect your balance and may make you more likely to have a fall. What actions can I take to lower my risk of falls? General instructions  Talk with your health care provider about your risks for falling. Tell your health care provider if: ? You fall. Be sure to tell your health care provider about all falls, even ones that seem minor. ? You feel dizzy, sleepy, or off-balance.  Take over-the-counter and prescription medicines only as told by your health care provider. These include any supplements.  Eat a healthy diet and maintain a healthy weight. A healthy diet includes low-fat dairy products, low-fat (lean) meats, and fiber from whole grains, beans, and lots of fruits and vegetables. Home safety  Remove any tripping hazards, such as rugs, cords, and clutter.  Install safety equipment such as grab bars in bathrooms and safety rails on stairs.  Keep rooms and walkways well-lit. Activity   Follow a regular exercise program to stay fit. This will help you maintain your balance. Ask your health care provider what types of exercise are appropriate for you.  If you need a cane or   walker, use it as recommended by your health care provider.  Wear supportive shoes that have nonskid soles. Lifestyle  Do not drink alcohol if your health care provider tells you not to drink.  If you drink alcohol, limit how much you have: ? 0-1 drink a day for women. ? 0-2 drinks a day for men.  Be aware of how much alcohol is in your drink. In the [place] one drink equals one typical bottle of beer (12  oz), one-half glass of wine (5 oz), or one shot of hard liquor (1 oz).  Do not use any products that contain nicotine or tobacco, such as cigarettes and e-cigarettes. If you need help quitting, ask your health care provider. Summary  Having a healthy lifestyle and getting preventive care can help to protect your health and wellness after age 69.  Screening and testing are the best way to find a health problem early and help you avoid having a fall. Early diagnosis and treatment give you the best chance for managing medical conditions that are more common for people who are older than age 69.  Falls are a major cause of broken bones and head injuries in people who are older than age 69. Take precautions to prevent a fall at home.  Work with your health care provider to learn what changes you can make to improve your health and wellness and to prevent falls. This information is not intended to replace advice given to you by your health care provider. Make sure you discuss any questions you have with your health care provider. Document Released: 08/02/2017 Document Revised: 01/10/2019 Document Reviewed: 08/02/2017 Elsevier Patient Education  2020 Elsevier Inc.  

## 2019-09-06 ENCOUNTER — Other Ambulatory Visit: Payer: Self-pay

## 2019-09-09 ENCOUNTER — Other Ambulatory Visit: Payer: Self-pay

## 2019-09-09 ENCOUNTER — Ambulatory Visit (INDEPENDENT_AMBULATORY_CARE_PROVIDER_SITE_OTHER): Payer: BC Managed Care – PPO | Admitting: Family Medicine

## 2019-09-09 ENCOUNTER — Encounter: Payer: Self-pay | Admitting: Family Medicine

## 2019-09-09 VITALS — BP 114/61 | HR 56 | Temp 97.3°F | Resp 20 | Ht 60.0 in | Wt 123.0 lb

## 2019-09-09 DIAGNOSIS — Z0001 Encounter for general adult medical examination with abnormal findings: Secondary | ICD-10-CM

## 2019-09-09 DIAGNOSIS — Z1231 Encounter for screening mammogram for malignant neoplasm of breast: Secondary | ICD-10-CM

## 2019-09-09 DIAGNOSIS — E559 Vitamin D deficiency, unspecified: Secondary | ICD-10-CM

## 2019-09-09 DIAGNOSIS — Z0000 Encounter for general adult medical examination without abnormal findings: Secondary | ICD-10-CM

## 2019-09-09 DIAGNOSIS — E870 Hyperosmolality and hypernatremia: Secondary | ICD-10-CM

## 2019-09-09 DIAGNOSIS — D500 Iron deficiency anemia secondary to blood loss (chronic): Secondary | ICD-10-CM | POA: Diagnosis not present

## 2019-09-09 DIAGNOSIS — E782 Mixed hyperlipidemia: Secondary | ICD-10-CM | POA: Diagnosis not present

## 2019-09-09 DIAGNOSIS — K219 Gastro-esophageal reflux disease without esophagitis: Secondary | ICD-10-CM

## 2019-09-09 NOTE — Progress Notes (Signed)
Subjective:  Patient ID: Yvonne [name], female    DOB: 1951/04/27, 68 y.o.   MRN: [id number]  Patient Care Team: Baruch Gouty, FNP as PCP - General (Family Medicine)   Chief Complaint:  Annual Exam   HPI: Yvonne [name] is a 68 y.o. female presenting on 09/09/2019 for Annual Exam   Pt presents today for her annual physical exam. Pt states she is doing fairly well overall. States she still has occasional hemorrhoid bleeding and is followed by Dr. Laural Golden on a regular basis. States she is taking her Flintstone on a daily basis for iron and Vit D repletion. States she does not have any abnormal bleeding or bruising. States she does not have any fatigue, chest pain, shortness of breath, dizziness, or syncope. States she does have occasional GERD but this is well controlled with her medications. Denies other complaints or concerns.    Relevant past medical, surgical, family, and social history reviewed and updated as indicated.  Allergies and medications reviewed and updated. Date reviewed: Chart in Epic.   Past Medical History:  Diagnosis Date  . Abnormal exercise tolerance test   . Anemia   . Chest pain   . Diverticulosis   . GERD (gastroesophageal reflux disease)   . Hyperlipidemia     Past Surgical History:  Procedure Laterality Date  . APPENDECTOMY    . BIOPSY  10/17/2018   Procedure: BIOPSY;  Surgeon: Rogene Houston, MD;  Location: [facility];  Service: Endoscopy;;  gastric   . COMBINED HYSTEROSCOPY DIAGNOSTIC / D&C  12/24/2010  . ESOPHAGOGASTRODUODENOSCOPY N/A 10/17/2018   Procedure: ESOPHAGOGASTRODUODENOSCOPY (EGD);  Surgeon: Rogene Houston, MD;  Location: [facility];  Service: Endoscopy;  Laterality: N/A;  2:55  . EYE SURGERY     cosmetic to upper lids  . MOLE REMOVAL  2019  . TUBAL LIGATION      Social History   Socioeconomic History  . Marital status: Married    Spouse name: Not on file  . Number of children: Not on file  . Years of education:  Not on file  . Highest education level: Not on file  Occupational History  . Not on file  Social Needs  . Financial resource strain: Not on file  . Food insecurity    Worry: Not on file    Inability: Not on file  . Transportation needs    Medical: Not on file    Non-medical: Not on file  Tobacco Use  . Smoking status: Former Smoker    Quit date: 03/12/1983    Years since quitting: 36.5  . Smokeless tobacco: Never Used  . Tobacco comment: quit over 30 years ago  Substance and Sexual Activity  . Alcohol use: Yes    Comment: rare beer  . Drug use: No  . Sexual activity: Yes    Birth control/protection: None    Comment: DEC[employer]ED INSURANCE QUESTIONS,DES NEG  Lifestyle  . Physical activity    Days per week: Not on file    Minutes per session: Not on file  . Stress: Not on file  Relationships  . Social Herbalist on phone: Not on file    Gets together: Not on file    Attends religious service: Not on file    Active member of club or organization: Not on file    Attends meetings of clubs or organizations: Not on file    Relationship status: Not on file  .  Intimate partner violence    Fear of current or ex partner: Not on file    Emotionally abused: Not on file    Physically abused: Not on file    Forced sexual activity: Not on file  Other Topics Concern  . Not on file  Social History Narrative  . Not on file    Outpatient Encounter Medications as of 09/09/2019  Medication Sig  . dexlansoprazole (DEXILANT) 60 MG capsule Take 1 capsule (60 mg total) by mouth daily.  Marland Kitchen estradiol (ESTRACE VAGINAL) 0.1 MG/GM vaginal cream Insert 0.25 gram weekly vaginally twice a week.  . nitrofurantoin, macrocrystal-monohydrate, (MACROBID) 100 MG capsule Take 1 capsule (100 mg total) by mouth daily. Prophylaxis  . Pediatric Multivitamins-Iron (FLINTSTONES PLUS IRON) chewable tablet Chew 1 tablet by mouth 2 (two) times daily.  . [DISCONTINUED] amoxicillin (AMOXIL) 875 MG tablet Take  1 tablet (875 mg total) by mouth 2 (two) times daily. 1 po BID   No facility-administered encounter medications on file as of 09/09/2019.     Allergies  Allergen Reactions  . Sulfa Antibiotics     Rash and itching and eyes were red  . Tdap [Tetanus-Diphth-Acell Pertussis] Swelling    Review of Systems  Constitutional: Negative for activity change, appetite change, chills, diaphoresis, fatigue, fever and unexpected weight change.  HENT: Negative.   Eyes: Negative.  Negative for photophobia and visual disturbance.  Respiratory: Negative for cough, chest tightness and shortness of breath.   Cardiovascular: Negative for chest pain, palpitations and leg swelling.  Gastrointestinal: Positive for anal bleeding (hemorrhoids, rare). Negative for abdominal distention, abdominal pain, blood in stool, constipation, diarrhea, nausea, rectal pain and vomiting.  Endocrine: Negative.  Negative for cold intolerance, heat intolerance, polydipsia and polyphagia.  Genitourinary: Negative for decreased urine volume, difficulty urinating, dysuria, frequency and urgency.  Musculoskeletal: Negative for arthralgias and myalgias.  Skin: Negative.   Allergic/Immunologic: Negative.   Neurological: Negative for dizziness, tremors, seizures, syncope, facial asymmetry, speech difficulty, weakness, light-headedness, numbness and headaches.  Hematological: Negative.   Psychiatric/Behavioral: Negative for confusion, hallucinations, sleep disturbance and suicidal ideas.  All other systems reviewed and are negative.       Objective:  BP 114/61   Pulse (!) 56   Temp (!) 97.3 F (36.3 C)   Resp 20   Ht 5' (1.524 m)   Wt 123 lb (55.8 kg)   SpO2 100%   BMI 24.02 kg/m    Wt Readings from Last 3 Encounters:  09/09/19 123 lb (55.8 kg)  08/16/19 125 lb (56.7 kg)  10/17/18 127 lb 8 oz (57.8 kg)    Physical Exam Vitals signs and nursing note reviewed.  Constitutional:      General: She is not in acute  distress.    Appearance: Normal appearance. She is well-developed, well-groomed and normal weight. She is not ill-appearing, toxic-appearing or diaphoretic.  HENT:     Head: Normocephalic and atraumatic.     Jaw: There is normal jaw occlusion.     Right Ear: Hearing, tympanic membrane, ear canal and external ear normal.     Left Ear: Hearing, tympanic membrane, ear canal and external ear normal.     Nose: Nose normal.     Mouth/Throat:     Lips: Pink.     Mouth: Mucous membranes are moist.     Pharynx: Oropharynx is clear. Uvula midline.  Eyes:     General: Lids are normal.     Extraocular Movements: Extraocular movements intact.  Conjunctiva/sclera: Conjunctivae normal.     Pupils: Pupils are equal, round, and reactive to light.  Neck:     Musculoskeletal: Normal range of motion and neck supple.     Thyroid: No thyroid mass, thyromegaly or thyroid tenderness.     Vascular: No carotid bruit or JVD.     Trachea: Trachea and phonation normal.  Cardiovascular:     Rate and Rhythm: Normal rate and regular rhythm.     Chest Wall: PMI is not displaced.     Pulses: Normal pulses.     Heart sounds: Normal heart sounds. No murmur. No friction rub. No gallop.   Pulmonary:     Effort: Pulmonary effort is normal. No respiratory distress.     Breath sounds: Normal breath sounds. No wheezing.  Chest:     Breasts: Breasts are symmetrical.        Right: Normal.        Left: Normal.  Abdominal:     General: Bowel sounds are normal. There is no distension or abdominal bruit.     Palpations: Abdomen is soft. There is no hepatomegaly or splenomegaly.     Tenderness: There is no abdominal tenderness. There is no right CVA tenderness or left CVA tenderness.     Hernia: No hernia is present.  Genitourinary:    Comments: Declined today Musculoskeletal: Normal range of motion.     Right lower leg: No edema.     Left lower leg: No edema.  Lymphadenopathy:     Cervical: No cervical adenopathy.   Skin:    General: Skin is warm and dry.     Capillary Refill: Capillary refill takes less than 2 seconds.     Coloration: Skin is not cyanotic, jaundiced or pale.     Findings: No bruising or rash.  Neurological:     General: No focal deficit present.     Mental Status: She is alert and oriented to person, place, and time.     Cranial Nerves: Cranial nerves are intact. No cranial nerve deficit.     Sensory: Sensation is intact. No sensory deficit.     Motor: Motor function is intact. No weakness.     Coordination: Coordination is intact. Coordination normal.     Gait: Gait is intact. Gait normal.     Deep Tendon Reflexes: Reflexes are normal and symmetric. Reflexes normal.  Psychiatric:        Attention and Perception: Attention and perception normal.        Mood and Affect: Mood and affect normal.        Speech: Speech normal.        Behavior: Behavior normal. Behavior is cooperative.        Thought Content: Thought content normal.        Cognition and Memory: Cognition and memory normal.        Judgment: Judgment normal.     Results for orders placed or performed in visit on 07/01/19  CBC  Result Value Ref Range   WBC 4.1 3.8 - 10.8 Thousand/uL   RBC 3.25 (L) 3.80 - 5.10 Million/uL   Hemoglobin 9.4 (L) 11.7 - 15.5 g/dL   HCT 29.2 (L) 35.0 - 45.0 %   MCV 89.8 80.0 - 100.0 fL   MCH 28.9 27.0 - 33.0 pg   MCHC 32.2 32.0 - 36.0 g/dL   RDW 12.7 11.0 - 15.0 %   Platelets 273 140 - 400 Thousand/uL   MPV 10.6 7.5 - 12.5 fL  Pertinent labs & imaging results that were available during my care of the patient were reviewed by me and considered in my medical decision making.  Assessment & Plan:  Yvonne [name] was seen today for annual exam.  Diagnoses and all orders for this visit:  Annual physical exam Diet and exercise encouraged. Health maintenance discussed. Labs pending. Placed on list for DEXA.  -     CBC with Differential/Platelet -     CMP14+EGFR -     Lipid panel  -      Thyroid Panel With TSH -     Anemia Profile -     MM SCREENING BREAST TOMO BILATERAL; Future -     Vitamin D 25 hydroxy  Gastroesophageal reflux disease without esophagitis Followed by GI. Labs pending. Continue medications as prescribed.  -     CBC with Differential/Platelet -     Anemia Profile  Mixed hyperlipidemia Diet and exercise encouraged. Labs pending.  -     Thyroid Panel With TSH       -     Lipid Panel  Iron deficiency anemia due to chronic blood loss Denies abnormal bleeding or bruising. Labs pending. Followed by GI.  -     CBC with Differential/Platelet -     Anemia Profile  Hypernatremia Labs pending.  -     CMP14+EGFR  Encounter for screening mammogram for malignant neoplasm of breast -     MM SCREENING BREAST TOMO BILATERAL; Future  Vitamin D deficiency Continue repletion therapy. Labs pending.  -     Vitamin D 25 hydroxy     Continue all other maintenance medications.  Follow up plan: Return in about 3 months (around 12/08/2019), or if symptoms worsen or fail to improve, for anemia.  Continue healthy lifestyle choices, including diet (rich in fruits, vegetables, and lean proteins, and low in salt and simple carbohydrates) and exercise (at least 30 minutes of moderate physical activity daily).  Educational handout given for health maintenance  The above assessment and management plan was discussed with the patient. The patient verbalized understanding of and has agreed to the management plan. Patient is aware to call the clinic if they develop any new symptoms or if symptoms persist or worsen. Patient is aware when to return to the clinic for a follow-up visit. Patient educated on when it is appropriate to go to the emergency department.   Monia Pouch, FNP-C [facility] [phone]

## 2019-09-09 NOTE — Patient Instructions (Signed)
Health Maintenance After Age 69 After age 69, you are at a higher risk for certain long-term diseases and infections as well as injuries from falls. Falls are a major cause of broken bones and head injuries in people who are older than age 69. Getting regular preventive care can help to keep you healthy and well. Preventive care includes getting regular testing and making lifestyle changes as recommended by your health care provider. Talk with your health care provider about:  Which screenings and tests you should have. A screening is a test that checks for a disease when you have no symptoms.  A diet and exercise plan that is right for you. What should I know about screenings and tests to prevent falls? Screening and testing are the best ways to find a health problem early. Early diagnosis and treatment give you the best chance of managing medical conditions that are common after age 69. Certain conditions and lifestyle choices may make you more likely to have a fall. Your health care provider may recommend:  Regular vision checks. Poor vision and conditions such as cataracts can make you more likely to have a fall. If you wear glasses, make sure to get your prescription updated if your vision changes.  Medicine review. Work with your health care provider to regularly review all of the medicines you are taking, including over-the-counter medicines. Ask your health care provider about any side effects that may make you more likely to have a fall. Tell your health care provider if any medicines that you take make you feel dizzy or sleepy.  Osteoporosis screening. Osteoporosis is a condition that causes the bones to get weaker. This can make the bones weak and cause them to break more easily.  Blood pressure screening. Blood pressure changes and medicines to control blood pressure can make you feel dizzy.  Strength and balance checks. Your health care provider may recommend certain tests to check your  strength and balance while standing, walking, or changing positions.  Foot health exam. Foot pain and numbness, as well as not wearing proper footwear, can make you more likely to have a fall.  Depression screening. You may be more likely to have a fall if you have a fear of falling, feel emotionally low, or feel unable to do activities that you used to do.  Alcohol use screening. Using too much alcohol can affect your balance and may make you more likely to have a fall. What actions can I take to lower my risk of falls? General instructions  Talk with your health care provider about your risks for falling. Tell your health care provider if: ? You fall. Be sure to tell your health care provider about all falls, even ones that seem minor. ? You feel dizzy, sleepy, or off-balance.  Take over-the-counter and prescription medicines only as told by your health care provider. These include any supplements.  Eat a healthy diet and maintain a healthy weight. A healthy diet includes low-fat dairy products, low-fat (lean) meats, and fiber from whole grains, beans, and lots of fruits and vegetables. Home safety  Remove any tripping hazards, such as rugs, cords, and clutter.  Install safety equipment such as grab bars in bathrooms and safety rails on stairs.  Keep rooms and walkways well-lit. Activity   Follow a regular exercise program to stay fit. This will help you maintain your balance. Ask your health care provider what types of exercise are appropriate for you.  If you need a cane or   walker, use it as recommended by your health care provider.  Wear supportive shoes that have nonskid soles. Lifestyle  Do not drink alcohol if your health care provider tells you not to drink.  If you drink alcohol, limit how much you have: ? 0-1 drink a day for women. ? 0-2 drinks a day for men.  Be aware of how much alcohol is in your drink. In the [place] one drink equals one typical bottle of beer (12  oz), one-half glass of wine (5 oz), or one shot of hard liquor (1 oz).  Do not use any products that contain nicotine or tobacco, such as cigarettes and e-cigarettes. If you need help quitting, ask your health care provider. Summary  Having a healthy lifestyle and getting preventive care can help to protect your health and wellness after age 69.  Screening and testing are the best way to find a health problem early and help you avoid having a fall. Early diagnosis and treatment give you the best chance for managing medical conditions that are more common for people who are older than age 69.  Falls are a major cause of broken bones and head injuries in people who are older than age 69. Take precautions to prevent a fall at home.  Work with your health care provider to learn what changes you can make to improve your health and wellness and to prevent falls. This information is not intended to replace advice given to you by your health care provider. Make sure you discuss any questions you have with your health care provider. Document Released: 08/02/2017 Document Revised: 01/10/2019 Document Reviewed: 08/02/2017 Elsevier Patient Education  2020 Elsevier Inc.  

## 2019-09-10 LAB — CMP14+EGFR
ALT: 14 IU/L (ref 0–32)
AST: 20 IU/L (ref 0–40)
Albumin/Globulin Ratio: 1.8 (ref 1.2–2.2)
Albumin: 4.5 g/dL (ref 3.8–4.8)
Alkaline Phosphatase: 97 IU/L (ref 39–117)
BUN/Creatinine Ratio: 25 (ref 12–28)
BUN: 20 mg/dL (ref 8–27)
Bilirubin Total: 0.4 mg/dL (ref 0.0–1.2)
CO2: 26 mmol/L (ref 20–29)
Calcium: 9.3 mg/dL (ref 8.7–10.3)
Chloride: 104 mmol/L (ref 96–106)
Creatinine, Ser: 0.79 mg/dL (ref 0.57–1.00)
GFR calc Af Amer: 88 mL/min/{1.73_m2} (ref >59)
GFR calc non Af Amer: 77 mL/min/{1.73_m2} (ref >59)
Globulin, Total: 2.5 g/dL (ref 1.5–4.5)
Glucose: 83 mg/dL (ref 65–99)
Potassium: 4.3 mmol/L (ref 3.5–5.2)
Sodium: 142 mmol/L (ref 134–144)
Total Protein: 7 g/dL (ref 6.0–8.5)

## 2019-09-10 LAB — ANEMIA PROFILE B
Basophils Absolute: 0 10*3/uL (ref 0.0–0.2)
Basos: 1 %
EOS (ABSOLUTE): 0.1 10*3/uL (ref 0.0–0.4)
Eos: 2 %
Ferritin: 36 ng/mL (ref 15–150)
Folate: 13.3 ng/mL (ref >3.0)
Hematocrit: 40 % (ref 34.0–46.6)
Hemoglobin: 12.7 g/dL (ref 11.1–15.9)
Immature Grans (Abs): 0 10*3/uL (ref 0.0–0.1)
Immature Granulocytes: 0 %
Iron Saturation: 21 % (ref 15–55)
Iron: 92 ug/dL (ref 27–139)
Lymphocytes Absolute: 1.9 10*3/uL (ref 0.7–3.1)
Lymphs: 41 %
MCH: 27.7 pg (ref 26.6–33.0)
MCHC: 31.8 g/dL (ref 31.5–35.7)
MCV: 87 fL (ref 79–97)
Monocytes Absolute: 0.4 10*3/uL (ref 0.1–0.9)
Monocytes: 8 %
Neutrophils Absolute: 2.2 10*3/uL (ref 1.4–7.0)
Neutrophils: 48 %
Platelets: 210 10*3/uL (ref 150–450)
RBC: 4.58 x10E6/uL (ref 3.77–5.28)
RDW: 16.5 % — ABNORMAL HIGH (ref 11.7–15.4)
Retic Ct Pct: 0.6 % (ref 0.6–2.6)
Total Iron Binding Capacity: 429 ug/dL (ref 250–450)
UIBC: 337 ug/dL (ref 118–369)
Vitamin B-12: 403 pg/mL (ref 232–1245)
WBC: 4.5 10*3/uL (ref 3.4–10.8)

## 2019-09-10 LAB — THYROID PANEL WITH TSH
Free Thyroxine Index: 2.1 (ref 1.2–4.9)
T3 Uptake Ratio: 25 % (ref 24–39)
T4, Total: 8.5 ug/dL (ref 4.5–12.0)
TSH: 1.62 u[IU]/mL (ref 0.450–4.500)

## 2019-09-10 LAB — LIPASE: Lipase: 33 U/L (ref 14–72)

## 2019-09-10 LAB — VITAMIN D 25 HYDROXY (VIT D DEFICIENCY, FRACTURES): Vit D, 25-Hydroxy: 26.2 ng/mL — ABNORMAL LOW (ref 30.0–100.0)

## 2019-09-21 ENCOUNTER — Other Ambulatory Visit: Payer: Self-pay | Admitting: Obstetrics & Gynecology

## 2019-10-14 ENCOUNTER — Other Ambulatory Visit: Payer: Self-pay

## 2019-10-14 ENCOUNTER — Ambulatory Visit: Payer: BC Managed Care – PPO | Admitting: Nurse Practitioner

## 2019-10-14 ENCOUNTER — Ambulatory Visit (INDEPENDENT_AMBULATORY_CARE_PROVIDER_SITE_OTHER): Payer: BC Managed Care – PPO

## 2019-10-14 ENCOUNTER — Encounter: Payer: Self-pay | Admitting: Nurse Practitioner

## 2019-10-14 VITALS — BP 109/66 | HR 66 | Temp 97.8°F | Resp 20 | Ht 60.0 in | Wt 126.0 lb

## 2019-10-14 DIAGNOSIS — S60212A Contusion of left wrist, initial encounter: Secondary | ICD-10-CM

## 2019-10-14 DIAGNOSIS — M25532 Pain in left wrist: Secondary | ICD-10-CM

## 2019-10-14 DIAGNOSIS — S60041A Contusion of right ring finger without damage to nail, initial encounter: Secondary | ICD-10-CM | POA: Diagnosis not present

## 2019-10-14 DIAGNOSIS — S6991XA Unspecified injury of right wrist, hand and finger(s), initial encounter: Secondary | ICD-10-CM | POA: Diagnosis not present

## 2019-10-14 DIAGNOSIS — S6992XA Unspecified injury of left wrist, hand and finger(s), initial encounter: Secondary | ICD-10-CM | POA: Diagnosis not present

## 2019-10-14 DIAGNOSIS — M79641 Pain in right hand: Secondary | ICD-10-CM

## 2019-10-14 NOTE — Patient Instructions (Signed)
Contusion A contusion is a deep bruise. This is a result of an injury that causes bleeding under the skin. Symptoms of bruising include pain, swelling, and discolored skin. The skin may turn blue, purple, or yellow. Follow these instructions at home: Managing pain, stiffness, and swelling You may use RICE. This stands for:  Resting.  Icing.  Compression, or putting pressure.  Elevating, or raising the injured area. To follow this method, do these actions:  Rest the injured area.  If told, put ice on the injured area. ? Put ice in a plastic bag. ? Place a towel between your skin and the bag. ? Leave the ice on for 20 minutes, 2-3 times per day.  If told, put light pressure (compression) on the injured area using an elastic bandage. Make sure the bandage is not too tight. If the area tingles or becomes numb, remove it and put it back on as told by your doctor.  If possible, raise (elevate) the injured area above the level of your heart while you are sitting or lying down.  General instructions  Take over-the-counter and prescription medicines only as told by your doctor.  Keep all follow-up visits as told by your doctor. This is important. Contact a doctor if:  Your symptoms do not get better after several days of treatment.  Your symptoms get worse.  You have trouble moving the injured area. Get help right away if:  You have very bad pain.  You have a loss of feeling (numbness) in a hand or foot.  Your hand or foot turns pale or cold. Summary  A contusion is a deep bruise. This is a result of an injury that causes bleeding under the skin.  Symptoms of bruising include pain, swelling, and discolored skin. The skin may turn blue, purple, or yellow.  This condition is treated with rest, ice, compression, and elevation. This is also called RICE. You may be given over-the-counter medicines for pain.  Contact a doctor if you do not feel better, or you feel worse. Get  help right away if you have very bad pain, have lost feeling in a hand or foot, or the area turns pale or cold. This information is not intended to replace advice given to you by your health care provider. Make sure you discuss any questions you have with your health care provider. Document Revised: 05/11/2018 Document Reviewed: 05/11/2018 Elsevier Patient Education  2020 Elsevier Inc.  

## 2019-10-14 NOTE — Progress Notes (Signed)
   Subjective:    Patient ID: Yvonne [name], female    DOB: 12/03/1950, 69 y.o.   MRN: [id number]   Chief Complaint: Fall (Happened Sat. Left wrist and right finger pain)   HPI Patient come sin today stating that fell and landed on her left wrist. She fell last last Saturday and has had pain in left wrist since. She said she trippr over the side walk outside.   Review of Systems  Musculoskeletal: Positive for arthralgias (left wrist).  All other systems reviewed and are negative.      Objective:   Physical Exam Vitals and nursing note reviewed.  Constitutional:      Appearance: Normal appearance.  Cardiovascular:     Rate and Rhythm: Normal rate and regular rhythm.     Heart sounds: Normal heart sounds.  Pulmonary:     Breath sounds: Normal breath sounds.  Musculoskeletal:     Comments: FROM o left wrist without pain. Mild pain on palpation to dorsal surfae of left hand with  Mild edema Grips equal bil.  Skin:    General: Skin is warm.  Neurological:     General: No focal deficit present.     Mental Status: She is alert and oriented to person, place, and time.  Psychiatric:        Mood and Affect: Mood normal.        Behavior: Behavior normal.    BP 109/66   Pulse 66   Temp 97.8 F (36.6 C) (Temporal)   Resp 20   Ht 5' (1.524 m)   Wt 126 lb (57.2 kg)   SpO2 100%   BMI 24.61 kg/m   Right middle finger- no fracture-[name], FNP Left wrist - no fracture-[name], FNP     Assessment & Plan:  Yvonne [name] in today with chief complaint of Fall (Happened Sat. Left wrist and right finger pain)   1. Pain of right hand - DG Hand Complete Right; Future  2. Contusion of right ring finger without damage to nail, initial encounter  3. Left wrist pain - DG Wrist Complete Left; Future  4. Contusion of left wrist, initial encounter Rest Ice Wrap with ace wrap-left wrist Elevate when sitting Motrin or tylenol OTC for pain RTO  prn  [name], FNP

## 2019-11-08 ENCOUNTER — Telehealth: Payer: Self-pay | Admitting: Family Medicine

## 2019-11-08 DIAGNOSIS — Z23 Encounter for immunization: Secondary | ICD-10-CM | POA: Diagnosis not present

## 2019-11-08 NOTE — Telephone Encounter (Signed)
Aware. 

## 2019-11-08 NOTE — Telephone Encounter (Signed)
No reported reactions in pts with sulfa allergies

## 2019-12-03 ENCOUNTER — Other Ambulatory Visit: Payer: Self-pay

## 2019-12-03 ENCOUNTER — Encounter: Payer: Self-pay | Admitting: Obstetrics & Gynecology

## 2019-12-03 ENCOUNTER — Ambulatory Visit (INDEPENDENT_AMBULATORY_CARE_PROVIDER_SITE_OTHER): Payer: BC Managed Care – PPO | Admitting: Obstetrics & Gynecology

## 2019-12-03 VITALS — BP 124/82 | Ht 59.5 in | Wt 125.0 lb

## 2019-12-03 DIAGNOSIS — Z01419 Encounter for gynecological examination (general) (routine) without abnormal findings: Secondary | ICD-10-CM

## 2019-12-03 DIAGNOSIS — N3090 Cystitis, unspecified without hematuria: Secondary | ICD-10-CM | POA: Diagnosis not present

## 2019-12-03 DIAGNOSIS — Z780 Asymptomatic menopausal state: Secondary | ICD-10-CM

## 2019-12-03 DIAGNOSIS — N952 Postmenopausal atrophic vaginitis: Secondary | ICD-10-CM | POA: Diagnosis not present

## 2019-12-03 MED ORDER — ESTRADIOL 0.1 MG/GM VA CREA: TOPICAL_CREAM | VAGINAL | 3 refills | Status: DC

## 2019-12-03 MED ORDER — NITROFURANTOIN MONOHYD MACRO 100 MG PO CAPS: 100.0000 mg | ORAL_CAPSULE | Freq: Every day | ORAL | 4 refills | Status: DC

## 2019-12-03 NOTE — Progress Notes (Signed)
KIASHA BELLIN 25-Nov-1950 [id number]   History:    69 y.o. G3P2A1 Married.    RP:  Established patient for annual gyn exam.  HPI:  Postmenopause, well on no HRT, except occasional Estrace cream use.  No PMB.  No pelvic pain.  Abstinent.  Breasts wnl.  Urine normal.  No recurrence of cystitis on prophylactic MacroBID.  BMs normal.  BMI 24.82.  Walking regularly.  Health Labs with Fam MD.   Past medical history,surgical history, family history and social history were all reviewed and documented in the EPIC chart.  Gynecologic History No LMP recorded. Patient is postmenopausal.  Obstetric History OB History  Gravida Para Term Preterm AB Living  3 2     1 2   SAB TAB Ectopic Multiple Live Births  1            # Outcome Date GA Lbr Len/2nd Weight Sex Delivery Anes PTL Lv  3 SAB           2 Para           1 Para              ROS: A ROS was performed and pertinent positives and negatives are included in the history.  GENERAL: No fevers or chills. HEENT: No change in vision, no earache, sore throat or sinus congestion. NECK: No pain or stiffness. CARDIOVASCULAR: No chest pain or pressure. No palpitations. PULMONARY: No shortness of breath, cough or wheeze. GASTROINTESTINAL: No abdominal pain, nausea, vomiting or diarrhea, melena or bright red blood per rectum. GENITOURINARY: No urinary frequency, urgency, hesitancy or dysuria. MUSCULOSKELETAL: No joint or muscle pain, no back pain, no recent trauma. DERMATOLOGIC: No rash, no itching, no lesions. ENDOCRINE: No polyuria, polydipsia, no heat or cold intolerance. No recent change in weight. HEMATOLOGICAL: No anemia or easy bruising or bleeding. NEUROLOGIC: No headache, seizures, numbness, tingling or weakness. PSYCHIATRIC: No depression, no loss of interest in normal activity or change in sleep pattern.     Exam:   BP 124/82   Ht 4' 11.5" (1.511 m)   Wt 125 lb (56.7 kg)   BMI 24.82 kg/m   Body mass index is 24.82 kg/m.  General  appearance : Well developed well nourished female. No acute distress HEENT: Eyes: no retinal hemorrhage or exudates,  Neck supple, trachea midline, no carotid bruits, no thyroidmegaly Lungs: Clear to auscultation, no rhonchi or wheezes, or rib retractions  Heart: Regular rate and rhythm, no murmurs or gallops Breast:Examined in sitting and supine position were symmetrical in appearance, no palpable masses or tenderness,  no skin retraction, no nipple inversion, no nipple discharge, no skin discoloration, no axillary or supraclavicular lymphadenopathy Abdomen: no palpable masses or tenderness, no rebound or guarding Extremities: no edema or skin discoloration or tenderness  Pelvic: Vulva: Normal             Vagina: No gross lesions or discharge  Cervix: No gross lesions or discharge.  Pap reflex done.  Uterus  AV, normal size, shape and consistency, non-tender and mobile  Adnexa  Without masses or tenderness  Anus: Normal   Assessment/Plan:  69 y.o. female for annual exam   1. Encounter for routine gynecological examination with Papanicolaou smear of cervix Normal gynecologic exam in menopause.  Pap test done.  Breast exam normal.  Last screening mammogram was benign in January 2020, will schedule screening mammogram now.  Colonoscopy in October 2018.  Health labs with family physician.  Body mass index  24.82.  Good fitness and healthy nutrition.  2. Postmenopausal Well on no systemic hormone replacement therapy.  No postmenopausal bleeding.  3. Postmenopausal atrophic vaginitis Well on estradiol cream a quarter of an applicator once to twice a week.  No contraindication to continue.  Prescription sent to pharmacy.  4. Recurrent cystitis No recurrence of acute cystitis on nitrofurantoin prophylaxis.  No contraindication to continue.  100 mg/capsule 1 capsule PO daily prescribed.  Other orders - nitrofurantoin, macrocrystal-monohydrate, (MACROBID) 100 MG capsule; Take 1 capsule (100 mg  total) by mouth daily. Prophylaxis - estradiol (ESTRACE VAGINAL) 0.1 MG/GM vaginal cream; Insert 0.25 gram weekly vaginally twice a week.  Princess Bruins MD, 4:05 PM 12/03/2019

## 2019-12-04 ENCOUNTER — Encounter: Payer: Self-pay | Admitting: Obstetrics & Gynecology

## 2019-12-04 NOTE — Patient Instructions (Signed)
1. Encounter for routine gynecological examination with Papanicolaou smear of cervix Normal gynecologic exam in menopause.  Pap test done.  Breast exam normal.  Last screening mammogram was benign in January 2020, will schedule screening mammogram now.  Colonoscopy in October 2018.  Health labs with family physician.  Body mass index 24.82.  Good fitness and healthy nutrition.  2. Postmenopausal Well on no systemic hormone replacement therapy.  No postmenopausal bleeding.  3. Postmenopausal atrophic vaginitis Well on estradiol cream a quarter of an applicator once to twice a week.  No contraindication to continue.  Prescription sent to pharmacy.  4. Recurrent cystitis No recurrence of acute cystitis on nitrofurantoin prophylaxis.  No contraindication to continue.  100 mg/capsule 1 capsule PO daily prescribed.  Other orders - nitrofurantoin, macrocrystal-monohydrate, (MACROBID) 100 MG capsule; Take 1 capsule (100 mg total) by mouth daily. Prophylaxis - estradiol (ESTRACE VAGINAL) 0.1 MG/GM vaginal cream; Insert 0.25 gram weekly vaginally twice a week.  [place]irah, it was a pleasure seeing you today!  I will inform you of your results as soon as they are available.

## 2019-12-05 LAB — PAP IG W/ RFLX HPV ASCU

## 2019-12-07 DIAGNOSIS — Z23 Encounter for immunization: Secondary | ICD-10-CM | POA: Diagnosis not present

## 2020-01-20 ENCOUNTER — Ambulatory Visit (INDEPENDENT_AMBULATORY_CARE_PROVIDER_SITE_OTHER): Payer: BC Managed Care – PPO | Admitting: Family Medicine

## 2020-01-20 ENCOUNTER — Encounter: Payer: Self-pay | Admitting: Family Medicine

## 2020-01-20 DIAGNOSIS — S20212A Contusion of left front wall of thorax, initial encounter: Secondary | ICD-10-CM | POA: Diagnosis not present

## 2020-01-20 NOTE — Progress Notes (Signed)
Subjective:    Patient ID: [name], female    DOB: 09-16-1951, 69 y.o.   MRN: [id number]   HPI: Yvonne [name] is a 69 y.o. female presenting for falling a week ago. Tripped. No syncope. Hit left torso under the left breast.  She has had pain localized to that area since that time.  She has had no hemoptysis.  Some dyspnea yesterday. Pt. Has upset stomach. LGF and HA today. Son had a stomach bug a few days ago. She has had her CoVID vaccines.     Depression screen [facility] 2/9 10/14/2019 09/09/2019 08/16/2019 09/21/2018 09/03/2018  Decreased Interest 0 0 0 0 0  Down, Depressed, Hopeless 0 0 0 0 0  PHQ - 2 Score 0 0 0 0 0     Relevant past medical, surgical, family and social history reviewed and updated as indicated.  Interim medical history since our last visit reviewed. Allergies and medications reviewed and updated.  ROS:  Review of Systems  Constitutional: Negative.   HENT: Negative.   Eyes: Negative for visual disturbance.  Respiratory: Negative for shortness of breath.   Cardiovascular: Negative for chest pain.  Gastrointestinal: Positive for nausea. Negative for abdominal pain, diarrhea and vomiting.  Musculoskeletal: Negative for arthralgias.     Social History   Tobacco Use  Smoking Status Former Smoker  . Quit date: 03/12/1983  . Years since quitting: 36.8  Smokeless Tobacco Never Used  Tobacco Comment   quit over 30 years ago       Objective:     Wt Readings from Last 3 Encounters:  12/03/19 125 lb (56.7 kg)  10/14/19 126 lb (57.2 kg)  09/09/19 123 lb (55.8 kg)     Exam deferred. Pt. Harboring due to COVID 19. Phone visit performed.   Assessment & Plan:   1. Contusion of left chest wall, initial encounter     No orders of the defined types were placed in this encounter.   Orders Placed This Encounter  Procedures  . DG Ribs Unilateral W/Chest Left    Standing Status:   Future    Standing Expiration Date:   03/21/2021    Order Specific  Question:   Reason for Exam (SYMPTOM  OR DIAGNOSIS REQUIRED)    Answer:   fell. Pain at left lower chest for 1 week.    Order Specific Question:   Preferred imaging location?    Answer:   Internal      Diagnoses and all orders for this visit:  Contusion of left chest wall, initial encounter -     DG Ribs Unilateral W/Chest Left; Future    Virtual Visit via telephone Note  I discussed the limitations, risks, security and privacy concerns of performing an evaluation and management service by telephone and the availability of in person appointments. The patient was identified with two identifiers. Pt.expressed understanding and agreed to proceed. Pt. Is at home. Dr. Darlyn Read is in his office.  Follow Up Instructions:   I discussed the assessment and treatment plan with the patient. The patient was provided an opportunity to ask questions and all were answered. The patient agreed with the plan and demonstrated an understanding of the instructions.   The patient was advised to call back or seek an in-person evaluation if the symptoms worsen or if the condition fails to improve as anticipated.   Total minutes including chart review and phone contact time: 17   Follow up plan: Return if symptoms worsen or  fail to improve.  Claretta Fraise, MD [place]

## 2020-01-24 ENCOUNTER — Other Ambulatory Visit (INDEPENDENT_AMBULATORY_CARE_PROVIDER_SITE_OTHER): Payer: BC Managed Care – PPO

## 2020-01-24 ENCOUNTER — Other Ambulatory Visit: Payer: Self-pay

## 2020-01-24 DIAGNOSIS — S20212A Contusion of left front wall of thorax, initial encounter: Secondary | ICD-10-CM

## 2020-01-24 DIAGNOSIS — S299XXA Unspecified injury of thorax, initial encounter: Secondary | ICD-10-CM | POA: Diagnosis not present

## 2020-01-24 DIAGNOSIS — R0781 Pleurodynia: Secondary | ICD-10-CM | POA: Diagnosis not present

## 2020-01-29 ENCOUNTER — Telehealth: Payer: Self-pay | Admitting: Family Medicine

## 2020-01-29 NOTE — Telephone Encounter (Signed)
Pt made aware of results. No concerns at this time

## 2020-01-29 NOTE — Telephone Encounter (Signed)
Please review xray and advise results, patient is calling.

## 2020-01-29 NOTE — Telephone Encounter (Signed)
Her x-ray was negative for rib fracture or lung puncture. WS

## 2020-02-17 ENCOUNTER — Ambulatory Visit: Payer: Medicare Other | Admitting: Family Medicine

## 2020-02-17 DIAGNOSIS — J309 Allergic rhinitis, unspecified: Secondary | ICD-10-CM | POA: Diagnosis not present

## 2020-02-17 DIAGNOSIS — H66002 Acute suppurative otitis media without spontaneous rupture of ear drum, left ear: Secondary | ICD-10-CM | POA: Diagnosis not present

## 2020-06-12 DIAGNOSIS — L218 Other seborrheic dermatitis: Secondary | ICD-10-CM | POA: Diagnosis not present

## 2020-07-06 DIAGNOSIS — J029 Acute pharyngitis, unspecified: Secondary | ICD-10-CM | POA: Diagnosis not present

## 2020-07-09 ENCOUNTER — Ambulatory Visit: Payer: BC Managed Care – PPO | Admitting: Family Medicine

## 2020-07-31 DIAGNOSIS — H11002 Unspecified pterygium of left eye: Secondary | ICD-10-CM | POA: Diagnosis not present

## 2020-07-31 DIAGNOSIS — Z6823 Body mass index (BMI) 23.0-23.9, adult: Secondary | ICD-10-CM | POA: Diagnosis not present

## 2020-07-31 DIAGNOSIS — D649 Anemia, unspecified: Secondary | ICD-10-CM | POA: Diagnosis not present

## 2020-07-31 DIAGNOSIS — R300 Dysuria: Secondary | ICD-10-CM | POA: Diagnosis not present

## 2020-07-31 DIAGNOSIS — H40022 Open angle with borderline findings, high risk, left eye: Secondary | ICD-10-CM | POA: Diagnosis not present

## 2020-07-31 DIAGNOSIS — H2513 Age-related nuclear cataract, bilateral: Secondary | ICD-10-CM | POA: Diagnosis not present

## 2020-07-31 DIAGNOSIS — H52201 Unspecified astigmatism, right eye: Secondary | ICD-10-CM | POA: Diagnosis not present

## 2020-07-31 DIAGNOSIS — Z780 Asymptomatic menopausal state: Secondary | ICD-10-CM | POA: Diagnosis not present

## 2020-07-31 DIAGNOSIS — K219 Gastro-esophageal reflux disease without esophagitis: Secondary | ICD-10-CM | POA: Diagnosis not present

## 2020-08-05 ENCOUNTER — Other Ambulatory Visit: Payer: Self-pay | Admitting: Family Medicine

## 2020-08-05 DIAGNOSIS — R059 Cough, unspecified: Secondary | ICD-10-CM | POA: Diagnosis not present

## 2020-08-05 DIAGNOSIS — Z20828 Contact with and (suspected) exposure to other viral communicable diseases: Secondary | ICD-10-CM | POA: Diagnosis not present

## 2020-08-05 DIAGNOSIS — K219 Gastro-esophageal reflux disease without esophagitis: Secondary | ICD-10-CM

## 2020-08-05 DIAGNOSIS — J029 Acute pharyngitis, unspecified: Secondary | ICD-10-CM | POA: Diagnosis not present

## 2020-08-06 DIAGNOSIS — Z23 Encounter for immunization: Secondary | ICD-10-CM | POA: Diagnosis not present

## 2020-11-18 ENCOUNTER — Other Ambulatory Visit (HOSPITAL_COMMUNITY): Payer: Self-pay | Admitting: Family Medicine

## 2020-11-18 DIAGNOSIS — Z1231 Encounter for screening mammogram for malignant neoplasm of breast: Secondary | ICD-10-CM

## 2020-11-27 IMAGING — US ULTRASOUND LEFT BREAST LIMITED
1 series · 11 of 11 positions shown · non-contrast
Comparison: Previous exam(s).

CLINICAL DATA: Possible small irregular asymmetry in the lower
outer left breast middle depth and possible asymmetry in the
superior left breast posterior depth on a recent [facility] screening
mammogram.

EXAM:
DIGITAL DIAGNOSTIC LEFT MAMMOGRAM WITH CAD AND TOMO
ULTRASOUND LEFT BREAST

[Series 1: ultrasound left breast limited · 0.04mm/px · 11 of 11 slices shown]
[im 1/11]
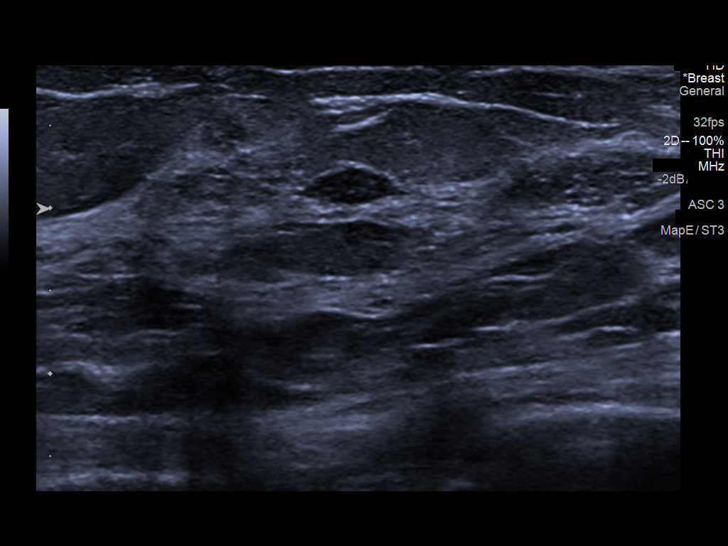
[im 2/11]
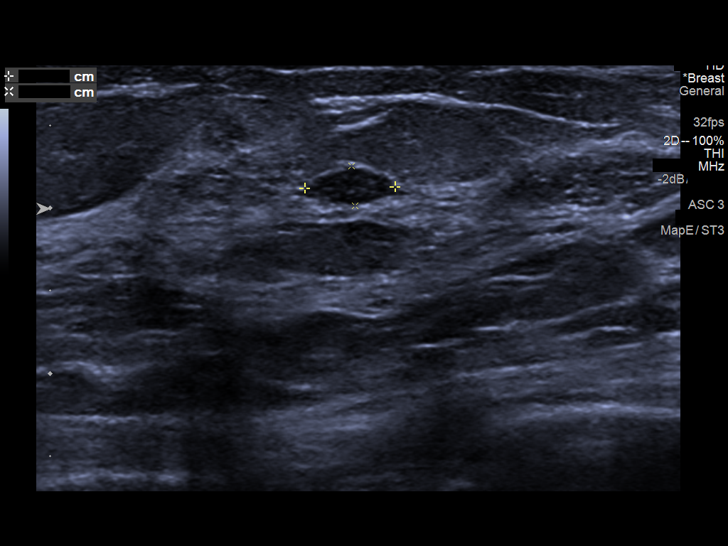
[im 3/11]
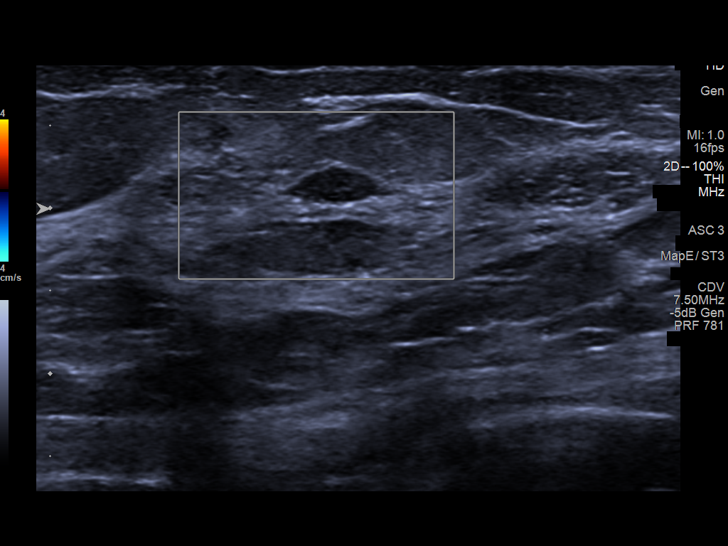
[im 4/11]
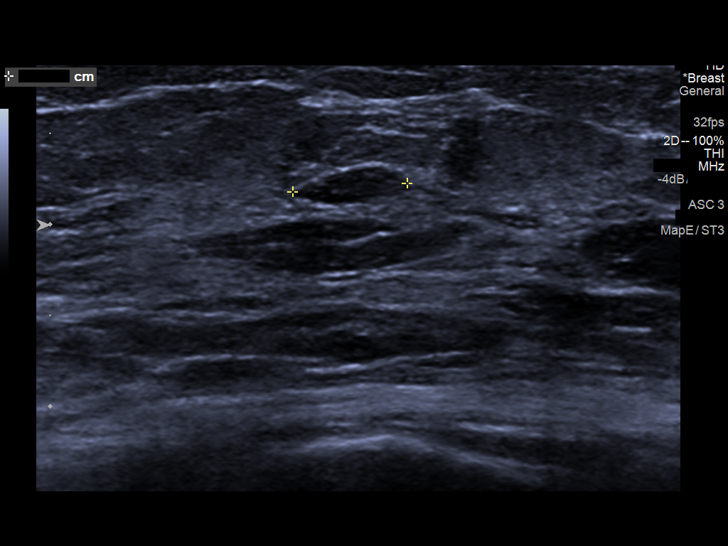
[im 5/11]
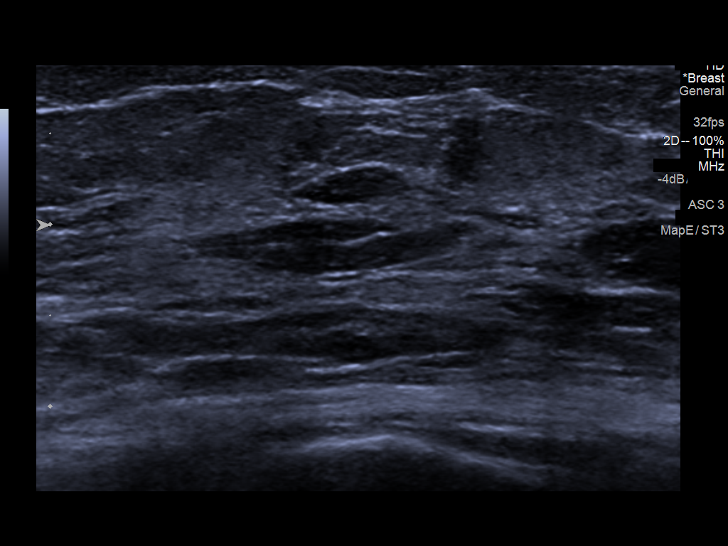
[im 6/11]
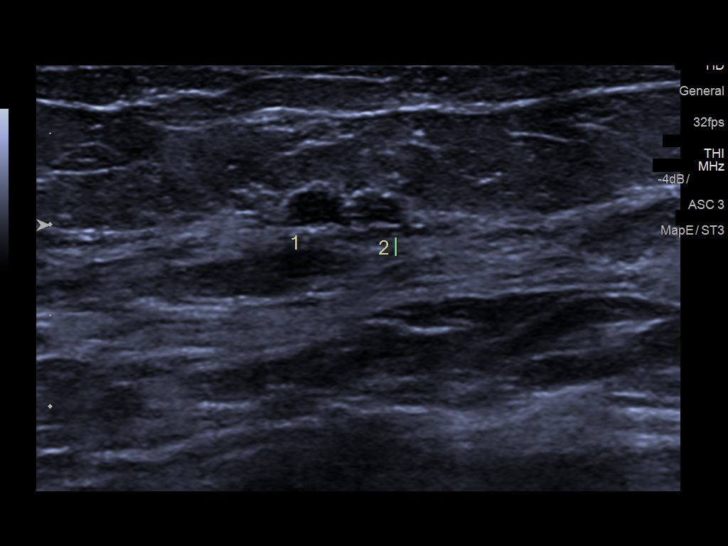
[im 7/11]
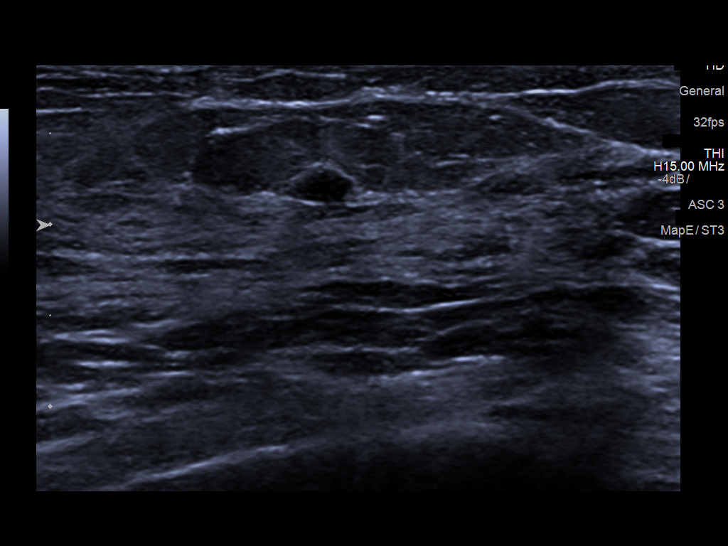
[im 8/11]
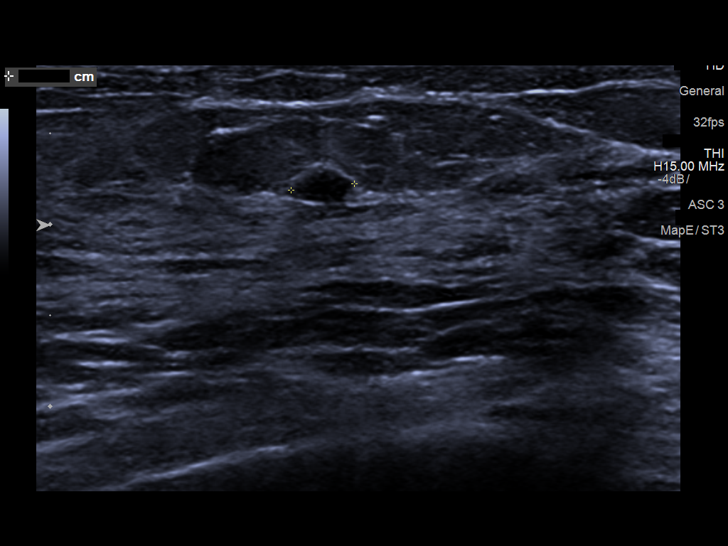
[im 9/11]
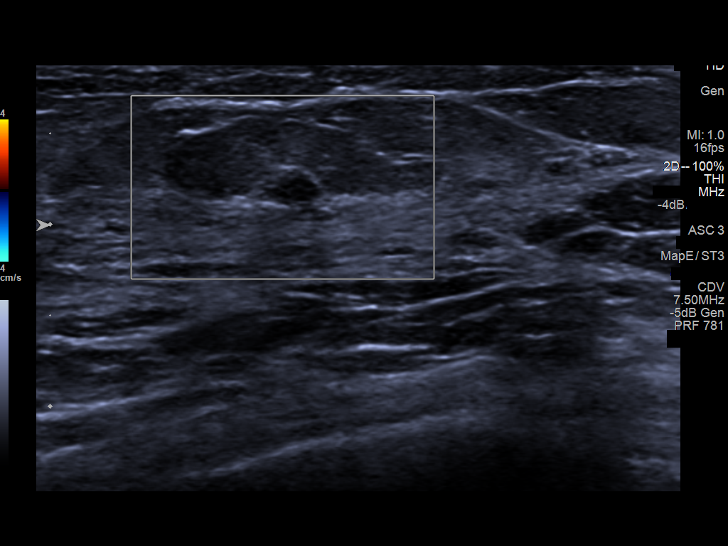
[im 10/11]
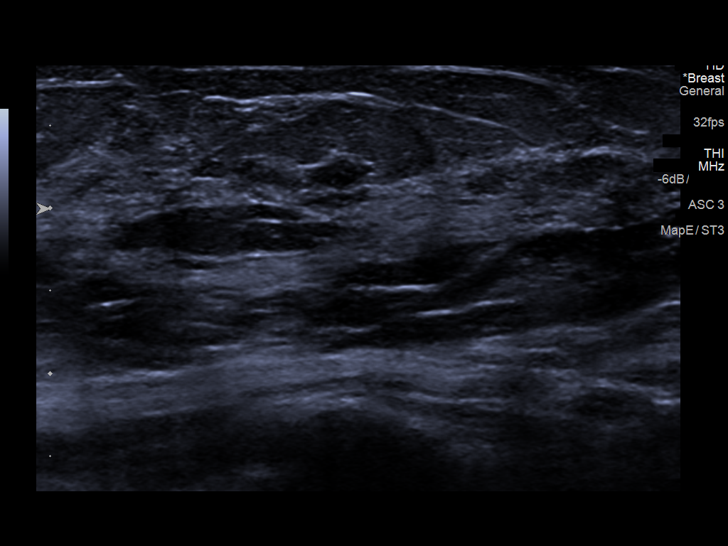
[im 11/11]
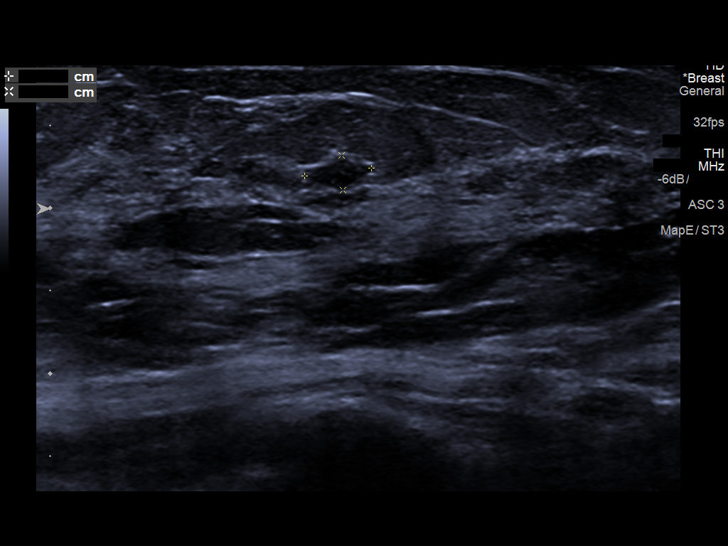

[11 of 11 positions shown; findings below may reference images not displayed]

Screening mammograms obtained at [facility] on 09/18/2018
and 01/28/2014. The previous 3D images are not available comparison.

ACR Breast Density Category c: The breast tissue is heterogeneously
dense, which may obscure small masses.
FINDINGS: 3D tomographic and 2D generated mammographic views of the left
breast confirm a small, oval, lobulated mass-like density with some
circumscribed and some obscured or indistinct margins in the lower
outer left breast, without significant change since 09/18/2018 and
09/29/2014.

In the upper outer left breast, in the oblique and true lateral
projections, there is a small, oval asymmetry with some
circumscribed and some obscured or indistinct margins.

Mammographic images were processed with CAD.

On physical exam, no mass palpable in the outer left breast.

Targeted ultrasound is performed, showing adjacent 6 x 6 x 2 mm and
4 x 4 x 2 mm oval, horizontally oriented, circumscribed, hypoechoic
masses in the 2:30 o'clock position of the left breast, 3 cm from
the nipple. These correspond to the mammographic findings which are
stable in the craniocaudal projection since 2396.
IMPRESSION: Mammographically stable adjacent 6 mm and 4 mm benign masses in the
2:30 o'clock position of the left breast. These could represent
mildly complicated cysts or fibroadenomas. The long-term stability
is compatible with a benign process.

RECOMMENDATION:
Bilateral screening mammogram in 1 year.

I have discussed the findings and recommendations with the patient.
Results were also provided in writing at the conclusion of the
visit. If applicable, a reminder letter will be sent to the patient
regarding the next appointment.

BI-RADS CATEGORY  2: Benign.

## 2020-12-03 ENCOUNTER — Other Ambulatory Visit: Payer: Self-pay | Admitting: Obstetrics & Gynecology

## 2020-12-04 ENCOUNTER — Ambulatory Visit (HOSPITAL_COMMUNITY): Payer: BLUE CROSS/BLUE SHIELD

## 2020-12-07 ENCOUNTER — Other Ambulatory Visit: Payer: Self-pay

## 2020-12-07 ENCOUNTER — Ambulatory Visit (HOSPITAL_COMMUNITY)
Admission: RE | Admit: 2020-12-07 | Discharge: 2020-12-07 | Disposition: A | Discharge status: Home / Self Care | Payer: BC Managed Care – PPO | Source: Ambulatory Visit | Attending: Family Medicine | Admitting: Family Medicine

## 2020-12-07 DIAGNOSIS — Z1231 Encounter for screening mammogram for malignant neoplasm of breast: Secondary | ICD-10-CM | POA: Diagnosis not present

## 2021-01-04 DIAGNOSIS — K219 Gastro-esophageal reflux disease without esophagitis: Secondary | ICD-10-CM | POA: Diagnosis not present

## 2021-01-04 DIAGNOSIS — Z1329 Encounter for screening for other suspected endocrine disorder: Secondary | ICD-10-CM | POA: Diagnosis not present

## 2021-01-04 DIAGNOSIS — Z13220 Encounter for screening for lipoid disorders: Secondary | ICD-10-CM | POA: Diagnosis not present

## 2021-01-07 DIAGNOSIS — Z6824 Body mass index (BMI) 24.0-24.9, adult: Secondary | ICD-10-CM | POA: Diagnosis not present

## 2021-01-07 DIAGNOSIS — Z0000 Encounter for general adult medical examination without abnormal findings: Secondary | ICD-10-CM | POA: Diagnosis not present

## 2021-01-11 ENCOUNTER — Encounter (INDEPENDENT_AMBULATORY_CARE_PROVIDER_SITE_OTHER): Payer: Self-pay | Admitting: *Deleted

## 2021-01-25 DIAGNOSIS — M79671 Pain in right foot: Secondary | ICD-10-CM | POA: Diagnosis not present

## 2021-01-25 DIAGNOSIS — Z6824 Body mass index (BMI) 24.0-24.9, adult: Secondary | ICD-10-CM | POA: Diagnosis not present

## 2021-01-25 DIAGNOSIS — M722 Plantar fascial fibromatosis: Secondary | ICD-10-CM | POA: Diagnosis not present

## 2021-02-09 DIAGNOSIS — M722 Plantar fascial fibromatosis: Secondary | ICD-10-CM | POA: Diagnosis not present

## 2021-02-09 DIAGNOSIS — M79672 Pain in left foot: Secondary | ICD-10-CM | POA: Diagnosis not present

## 2021-02-26 ENCOUNTER — Ambulatory Visit (INDEPENDENT_AMBULATORY_CARE_PROVIDER_SITE_OTHER): Payer: BC Managed Care – PPO | Admitting: Obstetrics & Gynecology

## 2021-02-26 ENCOUNTER — Encounter: Payer: Self-pay | Admitting: Obstetrics & Gynecology

## 2021-02-26 ENCOUNTER — Other Ambulatory Visit: Payer: Self-pay

## 2021-02-26 VITALS — BP 118/80 | Ht 59.0 in | Wt 128.0 lb

## 2021-02-26 DIAGNOSIS — N3090 Cystitis, unspecified without hematuria: Secondary | ICD-10-CM | POA: Diagnosis not present

## 2021-02-26 DIAGNOSIS — Z780 Asymptomatic menopausal state: Secondary | ICD-10-CM

## 2021-02-26 DIAGNOSIS — Z01419 Encounter for gynecological examination (general) (routine) without abnormal findings: Secondary | ICD-10-CM | POA: Diagnosis not present

## 2021-02-26 MED ORDER — NITROFURANTOIN MONOHYD MACRO 100 MG PO CAPS
100.0000 mg | ORAL_CAPSULE | Freq: Every day | ORAL | 4 refills | Status: DC
Start: 2021-02-26 — End: 2022-03-02

## 2021-02-26 NOTE — Progress Notes (Signed)
Yvonne [name] 1950-12-05 [id number]   History:    70 y.o. G3P2A1 Married.   RP:  Established patientfor annual gyn exam.  HPI:  Postmenopause, well on no HRT, except occasional Estrace cream use. No PMB. No pelvic pain.  Abstinent.  Breasts wnl.  Urine normal.  No recurrence of cystitis on prophylactic MacroBID.  BMs normal.  BMI 25.85.  Walking regularly.  Health Labs with Fam MD.  Alen Bleacher 2018.  BD normal 09/2018.   Past medical history,surgical history, family history and social history were all reviewed and documented in the EPIC chart.  Gynecologic History No LMP recorded. Patient is postmenopausal.  Obstetric History OB History  Gravida Para Term Preterm AB Living  3 2     1 2   SAB IAB Ectopic Multiple Live Births  1            # Outcome Date GA Lbr Len/2nd Weight Sex Delivery Anes PTL Lv  3 SAB           2 Para           1 Para              ROS: A ROS was performed and pertinent positives and negatives are included in the history.  GENERAL: No fevers or chills. HEENT: No change in vision, no earache, sore throat or sinus congestion. NECK: No pain or stiffness. CARDIOVASCULAR: No chest pain or pressure. No palpitations. PULMONARY: No shortness of breath, cough or wheeze. GASTROINTESTINAL: No abdominal pain, nausea, vomiting or diarrhea, melena or bright red blood per rectum. GENITOURINARY: No urinary frequency, urgency, hesitancy or dysuria. MUSCULOSKELETAL: No joint or muscle pain, no back pain, no recent trauma. DERMATOLOGIC: No rash, no itching, no lesions. ENDOCRINE: No polyuria, polydipsia, no heat or cold intolerance. No recent change in weight. HEMATOLOGICAL: No anemia or easy bruising or bleeding. NEUROLOGIC: No headache, seizures, numbness, tingling or weakness. PSYCHIATRIC: No depression, no loss of interest in normal activity or change in sleep pattern.     Exam:   BP 118/80 (BP Location: Right Arm, Patient Position: Sitting, Cuff Size: Normal)   Ht 4'  11" (1.499 m)   Wt 128 lb (58.1 kg)   BMI 25.85 kg/m   Body mass index is 25.85 kg/m.  General appearance : Well developed well nourished female. No acute distress HEENT: Eyes: no retinal hemorrhage or exudates,  Neck supple, trachea midline, no carotid bruits, no thyroidmegaly Lungs: Clear to auscultation, no rhonchi or wheezes, or rib retractions  Heart: Regular rate and rhythm, no murmurs or gallops Breast:Examined in sitting and supine position were symmetrical in appearance, no palpable masses or tenderness,  no skin retraction, no nipple inversion, no nipple discharge, no skin discoloration, no axillary or supraclavicular lymphadenopathy Abdomen: no palpable masses or tenderness, no rebound or guarding Extremities: no edema or skin discoloration or tenderness  Pelvic: Vulva: Normal             Vagina: No gross lesions or discharge  Cervix: No gross lesions or discharge  Uterus  AV, normal size, shape and consistency, non-tender and mobile  Adnexa  Without masses or tenderness  Anus: Normal   Assessment/Plan:  70 y.o. female for annual exam   1. Well female exam with routine gynecological exam Normal gynecologic exam in menopause.  Pap test negative in 12/2019, no indication to repeat at this time.  Breast exam normal.  Screening mammogram March 2022 was negative.  Colonoscopy in 2018.  Health labs  with family physician.  Body mass index is good at 25.85.  Continue with fitness and healthy nutrition.  2. Postmenopausal Well on no hormone replacement therapy.  No postmenopausal bleeding.  Bone density normal in September 2019, will repeat at 5 years.  3. Recurrent cystitis No recurrence of cystitis on nitrofurantoin prophylaxis daily.  No contraindication to continue.  Prescription sent to pharmacy.  Other orders - nitrofurantoin, macrocrystal-monohydrate, (MACROBID) 100 MG capsule; Take 1 capsule (100 mg total) by mouth daily. Prophylaxis  Genia Del MD, 9:17 AM  02/26/2021

## 2021-03-12 DIAGNOSIS — Z1389 Encounter for screening for other disorder: Secondary | ICD-10-CM | POA: Diagnosis not present

## 2021-03-12 DIAGNOSIS — K219 Gastro-esophageal reflux disease without esophagitis: Secondary | ICD-10-CM | POA: Diagnosis not present

## 2021-03-12 DIAGNOSIS — Z1331 Encounter for screening for depression: Secondary | ICD-10-CM | POA: Diagnosis not present

## 2021-03-12 DIAGNOSIS — Z6824 Body mass index (BMI) 24.0-24.9, adult: Secondary | ICD-10-CM | POA: Diagnosis not present

## 2021-03-12 DIAGNOSIS — D649 Anemia, unspecified: Secondary | ICD-10-CM | POA: Diagnosis not present

## 2021-03-12 DIAGNOSIS — J029 Acute pharyngitis, unspecified: Secondary | ICD-10-CM | POA: Diagnosis not present

## 2021-03-22 DIAGNOSIS — K219 Gastro-esophageal reflux disease without esophagitis: Secondary | ICD-10-CM | POA: Diagnosis not present

## 2021-03-22 DIAGNOSIS — E785 Hyperlipidemia, unspecified: Secondary | ICD-10-CM | POA: Diagnosis not present

## 2021-03-22 DIAGNOSIS — R0789 Other chest pain: Secondary | ICD-10-CM | POA: Diagnosis not present

## 2021-03-22 DIAGNOSIS — R0609 Other forms of dyspnea: Secondary | ICD-10-CM | POA: Diagnosis not present

## 2021-03-26 ENCOUNTER — Emergency Department (HOSPITAL_COMMUNITY)
Admission: EM | Admit: 2021-03-26 | Discharge: 2021-03-26 | Disposition: A | Discharge status: Home / Self Care | Payer: BC Managed Care – PPO | Attending: Emergency Medicine | Admitting: Emergency Medicine

## 2021-03-26 ENCOUNTER — Emergency Department (HOSPITAL_COMMUNITY): Payer: BC Managed Care – PPO

## 2021-03-26 DIAGNOSIS — R000 Tachycardia, unspecified: Secondary | ICD-10-CM | POA: Insufficient documentation

## 2021-03-26 DIAGNOSIS — R062 Wheezing: Secondary | ICD-10-CM | POA: Diagnosis not present

## 2021-03-26 DIAGNOSIS — I700 Atherosclerosis of aorta: Secondary | ICD-10-CM | POA: Diagnosis not present

## 2021-03-26 DIAGNOSIS — Z7982 Long term (current) use of aspirin: Secondary | ICD-10-CM | POA: Diagnosis not present

## 2021-03-26 DIAGNOSIS — Z20822 Contact with and (suspected) exposure to covid-19: Secondary | ICD-10-CM | POA: Diagnosis not present

## 2021-03-26 DIAGNOSIS — Z87891 Personal history of nicotine dependence: Secondary | ICD-10-CM | POA: Insufficient documentation

## 2021-03-26 DIAGNOSIS — J4520 Mild intermittent asthma, uncomplicated: Secondary | ICD-10-CM | POA: Diagnosis not present

## 2021-03-26 DIAGNOSIS — J929 Pleural plaque without asbestos: Secondary | ICD-10-CM | POA: Diagnosis not present

## 2021-03-26 DIAGNOSIS — R0602 Shortness of breath: Secondary | ICD-10-CM

## 2021-03-26 DIAGNOSIS — D649 Anemia, unspecified: Secondary | ICD-10-CM | POA: Diagnosis not present

## 2021-03-26 DIAGNOSIS — R918 Other nonspecific abnormal finding of lung field: Secondary | ICD-10-CM | POA: Diagnosis not present

## 2021-03-26 LAB — CBC WITH DIFFERENTIAL/PLATELET
Abs Immature Granulocytes: 0.04 10*3/uL (ref 0.00–0.07)
Basophils Absolute: 0 10*3/uL (ref 0.0–0.1)
Basophils Relative: 0 %
Eosinophils Absolute: 0.1 10*3/uL (ref 0.0–0.5)
Eosinophils Relative: 1 %
HCT: 29.3 % — ABNORMAL LOW (ref 36.0–46.0)
Hemoglobin: 9.1 g/dL — ABNORMAL LOW (ref 12.0–15.0)
Immature Granulocytes: 1 %
Lymphocytes Relative: 20 %
Lymphs Abs: 1.4 10*3/uL (ref 0.7–4.0)
MCH: 24.2 pg — ABNORMAL LOW (ref 26.0–34.0)
MCHC: 31.1 g/dL (ref 30.0–36.0)
MCV: 77.9 fL — ABNORMAL LOW (ref 80.0–100.0)
Monocytes Absolute: 0.4 10*3/uL (ref 0.1–1.0)
Monocytes Relative: 6 %
Neutro Abs: 5.1 10*3/uL (ref 1.7–7.7)
Neutrophils Relative %: 72 %
Platelets: 333 10*3/uL (ref 150–400)
RBC: 3.76 MIL/uL — ABNORMAL LOW (ref 3.87–5.11)
RDW: 15.7 % — ABNORMAL HIGH (ref 11.5–15.5)
WBC: 7 10*3/uL (ref 4.0–10.5)
nRBC: 0 % (ref 0.0–0.2)

## 2021-03-26 LAB — BASIC METABOLIC PANEL
Anion gap: 8 (ref 5–15)
BUN: 11 mg/dL (ref 8–23)
CO2: 26 mmol/L (ref 22–32)
Calcium: 9 mg/dL (ref 8.9–10.3)
Chloride: 105 mmol/L (ref 98–111)
Creatinine, Ser: 0.77 mg/dL (ref 0.44–1.00)
GFR, Estimated: >60 mL/min (ref >60)
Glucose, Bld: 139 mg/dL — ABNORMAL HIGH (ref 70–99)
Potassium: 3.1 mmol/L — ABNORMAL LOW (ref 3.5–5.1)
Sodium: 139 mmol/L (ref 135–145)

## 2021-03-26 LAB — D-DIMER, QUANTITATIVE: D-Dimer, Quant: 0.78 ug/mL-FEU — ABNORMAL HIGH (ref 0.00–0.50)

## 2021-03-26 LAB — RESP PANEL BY RT-PCR (FLU A&B, COVID) ARPGX2
Influenza A by PCR: NEGATIVE
Influenza B by PCR: NEGATIVE
SARS Coronavirus 2 by RT PCR: NEGATIVE

## 2021-03-26 MED ORDER — IOHEXOL 350 MG/ML SOLN
50.0000 mL | Freq: Once | INTRAVENOUS | Status: AC | PRN
  Administered 2021-03-26: 50 mL via INTRAVENOUS

## 2021-03-26 MED ORDER — ALBUTEROL SULFATE HFA 108 (90 BASE) MCG/ACT IN AERS: 1.0000 | INHALATION_SPRAY | Freq: Four times a day (QID) | RESPIRATORY_TRACT | 0 refills | Status: DC | PRN

## 2021-03-26 NOTE — ED Provider Notes (Addendum)
Emergency Medicine Provider Triage Evaluation Note  Yvonne [name] , a 70 y.o. female  was evaluated in triage.  Pt complains of shortness of breath x1 month.  She has been seen by her primary care provider who gave her antihistamines and Flonase.  She denies any chest pain.  She states that she has a history of iron deficiency anemia for which she has been treated in the past with iron supplements.  She does endorse BRBPR, intermittent.  History of hemorrhoids and diverticulosis.  Evidently there was wheezing per EMS and she was treated with albuterol with good effect.  Remote history of tobacco use.  No orthopnea.  Review of Systems  Positive: Shortness of breath Negative: Chest pain, fevers, chills, cough  Physical Exam  BP (!) 150/81   Pulse 94   Temp 99 F (37.2 C) (Oral)   Resp 16   SpO2 100%  Gen:   Awake, no distress  Resp:  Normal effort. No wheezing MSK:   Moves extremities without difficulty  Other:    Medical Decision Making  Medically screening exam initiated at 3:45 PM.  Appropriate orders placed.  Yvonne [name] was informed that the remainder of the evaluation will be completed by another provider, this initial triage assessment does not replace that evaluation, and the importance of remaining in the ED until their evaluation is complete.   Lorelee New, PA-C 03/26/21 1545    Lorelee New, PA-C 03/26/21 1547    Tilden Fossa, MD 03/26/21 1601

## 2021-03-26 NOTE — ED Provider Notes (Signed)
[facility] EMERGENCY [facility] Provider Note   CSN: [id number] Arrival date & time: 03/26/21  1532     History Chief Complaint  Patient presents with   Shortness of Breath    Yvonne Cancerulalia R Loree is a 70 y.o. female.  The history is provided by the patient and medical records.  Shortness of Breath Severity:  Mild Onset quality:  Gradual Duration:  1 month Timing:  Sporadic Progression:  Unchanged Chronicity:  Recurrent Context: not activity, not animal exposure, not emotional upset, not fumes, not known allergens, not occupational exposure, not pollens, not smoke exposure, not strong odors, not URI and not weather changes   Associated symptoms: no abdominal pain, no chest pain, no cough, no ear pain, no fever, no rash, no sore throat and no vomiting    Patient is a 70-year-old female with history listed below who presents to the emergency department with 1-2 months of sporadic episodes of shortness of breath lasting anywhere from 5-20 minutes at a time and resolving on their own. She has no chest pain with these episodes.  Nothing seems to bring them on and nothing seems to make them better.  They are not associated with exercise or exertion.  Commonly they occur even while sitting on the couch resting.  During the episode she does feel somewhat anxious and sometimes has difficulty speaking. Today she had an episode lasting longer than 20 minutes prompting her to call 911.  When EMS arrived they reported that she was tachypneic around 30 though not hypoxic on room air.  They reported some mild wheezing.  She was given a breathing treatment with improvement in her symptoms. At the time of my exam she reports feeling much better and is largely asymptomatic.  Endorses intermittent bright red blood per rectum she relates to her hemorrhoids that she has had for many years after having children.  Also has intermittent constipation which makes the hemorrhoids worse and causes  bleeding.  No dark or melena stool.  No hematemesis.  No abdominal pain, nausea or vomiting.     Past Medical History:  Diagnosis Date   Abnormal exercise tolerance test    Anemia    Chest pain    Diverticulosis    GERD (gastroesophageal reflux disease)    Hyperlipidemia     Patient Active Problem List   Diagnosis Date Noted   Vitamin D deficiency 09/09/2019   External hemorrhoid 08/16/2019   Absolute anemia 10/10/2018   Iron deficiency anemia 09/23/2018   Chronic anemia 09/21/2018   Hypernatremia 09/21/2018   Seasonal allergic rhinitis 01/23/2013   GERD (gastroesophageal reflux disease)    Hyperlipidemia     Past Surgical History:  Procedure Laterality Date   APPENDECTOMY     BIOPSY  10/17/2018   Procedure: BIOPSY;  Surgeon: Malissa Hippoehman, Najeeb U, MD;  Location: [facility];  Service: Endoscopy;;  gastric    COMBINED HYSTEROSCOPY DIAGNOSTIC / D&C  12/24/2010   ESOPHAGOGASTRODUODENOSCOPY N/A 10/17/2018   Procedure: ESOPHAGOGASTRODUODENOSCOPY (EGD);  Surgeon: Malissa Hippoehman, Najeeb U, MD;  Location: [facility];  Service: Endoscopy;  Laterality: N/A;  2:55   EYE SURGERY     cosmetic to upper lids   MOLE REMOVAL  2019   TUBAL LIGATION       OB History     Gravida  3   Para  2   Term      Preterm      AB  1   Living  2  SAB  1   IAB      Ectopic      Multiple      Live Births              Family History  Problem Relation Age of Onset   Hypertension Mother        possible   Nephrolithiasis Mother    Diabetes Brother    Diabetes Sister     Social History   Tobacco Use   Smoking status: Former    Pack years: 0.00    Types: Cigarettes    Quit date: 03/12/1983    Years since quitting: 38.0   Smokeless tobacco: Never   Tobacco comments:    quit over 30 years ago  Vaping Use   Vaping Use: Never used  Substance Use Topics   Alcohol use: Yes    Comment: rare beer   Drug use: No    Home Medications Prior to Admission medications    Medication Sig Start Date End Date Taking? Authorizing Provider  albuterol (VENTOLIN HFA) 108 (90 Base) MCG/ACT inhaler Inhale 1-2 puffs into the lungs every 6 (six) hours as needed for wheezing or shortness of breath. 03/26/21 04/25/21 Yes Ardeen Fillers, DO  aspirin EC 81 MG tablet Take 81 mg by mouth daily. Swallow whole.   Yes [provider]  cetirizine (ZYRTEC) 10 MG tablet Take 10 mg by mouth daily.   Yes [provider]  DEXILANT 60 MG capsule TAKE 1 CAPSULE BY MOUTH EVERY DAY Patient taking differently: Take 60 mg by mouth daily. 08/06/20  Yes Stacks, Broadus John, MD  fluticasone (FLONASE) 50 MCG/ACT nasal spray Place 2 sprays into both nostrils daily. 03/12/21  Yes [provider]  nitrofurantoin, macrocrystal-monohydrate, (MACROBID) 100 MG capsule Take 1 capsule (100 mg total) by mouth daily. Prophylaxis 02/26/21  Yes Genia Del, MD  Pediatric Multivitamins-Iron (FLINTSTONES PLUS IRON) chewable tablet Chew 1 tablet by mouth 2 (two) times daily. 10/17/18  Yes Rehman, Joline Maxcy, MD  predniSONE (DELTASONE) 20 MG tablet Take 40 mg by mouth daily. 01/25/21   [provider]    Allergies    Sulfa antibiotics and Tdap [tetanus-diphth-acell pertussis]  Review of Systems   Review of Systems  Constitutional:  Negative for chills and fever.  HENT:  Negative for ear pain and sore throat.   Eyes:  Negative for pain and visual disturbance.  Respiratory:  Positive for shortness of breath. Negative for cough.   Cardiovascular:  Negative for chest pain and palpitations.  Gastrointestinal:  Positive for anal bleeding (sporadically for many years). Negative for abdominal pain, diarrhea, nausea, rectal pain and vomiting.  Genitourinary:  Negative for dysuria and hematuria.  Musculoskeletal:  Negative for arthralgias and back pain.  Skin:  Negative for color change and rash.  Neurological:  Negative for seizures and syncope.  All other systems reviewed and are  negative.  Physical Exam Updated Vital Signs BP 130/67   Pulse 74   Temp 98.4 F (36.9 C) (Oral)   Resp 14   SpO2 100%   Physical Exam Vitals and nursing note reviewed.  Constitutional:      General: She is not in acute distress.    Appearance: She is well-developed. She is not ill-appearing or toxic-appearing.  HENT:     Head: Normocephalic and atraumatic.  Eyes:     Conjunctiva/sclera: Conjunctivae normal.  Cardiovascular:     Rate and Rhythm: Regular rhythm. Tachycardia present.     Pulses: Normal pulses.  Radial pulses are 2+ on the right side and 2+ on the left side.       Dorsalis pedis pulses are 2+ on the right side and 2+ on the left side.     Heart sounds: No murmur heard. Pulmonary:     Effort: Pulmonary effort is normal. No tachypnea, accessory muscle usage or respiratory distress.     Breath sounds: Normal breath sounds and air entry. No decreased air movement.  Abdominal:     Palpations: Abdomen is soft.     Tenderness: There is no abdominal tenderness.  Musculoskeletal:     Cervical back: Neck supple.     Right lower leg: No edema.     Left lower leg: No edema.  Skin:    General: Skin is warm and dry.  Neurological:     Mental Status: She is alert.    ED Results / Procedures / Treatments   Labs (all labs ordered are listed, but only abnormal results are displayed) Labs Reviewed  CBC WITH DIFFERENTIAL/PLATELET - Abnormal; Notable for the following components:      Result Value   RBC 3.76 (*)    Hemoglobin 9.1 (*)    HCT 29.3 (*)    MCV 77.9 (*)    MCH 24.2 (*)    RDW 15.7 (*)    All other components within normal limits  BASIC METABOLIC PANEL - Abnormal; Notable for the following components:   Potassium 3.1 (*)    Glucose, Bld 139 (*)    All other components within normal limits  D-DIMER, QUANTITATIVE - Abnormal; Notable for the following components:   D-Dimer, Quant 0.78 (*)    All other components within normal limits  RESP PANEL  BY RT-PCR (FLU A&B, COVID) [id number]    EKG EKG Interpretation  Date/Time:  Friday March 26 2021 15:37:41 EDT Ventricular Rate:  91 PR Interval:  136 QRS Duration: 68 QT Interval:  402 QTC Calculation: 494 R Axis:   8 Text Interpretation: Normal sinus rhythm Nonspecific ST abnormality Prolonged QT Abnormal ECG No old tracing to compare Confirmed by Derwood Kaplan [phone]) on 03/26/2021 8:43:25 PM  Radiology DG Chest 1 View  Result Date: 03/26/2021 CLINICAL DATA:  Shortness of breath EXAM: CHEST  1 VIEW COMPARISON:  01/24/2020 FINDINGS: The heart size and mediastinal contours are within normal limits. Both lungs are clear. The visualized skeletal structures are unremarkable. IMPRESSION: No active disease. Electronically Signed   By: Jasmine Pang M.D.   On: 03/26/2021 16:42   CT Angio Chest PE W and/or Wo Contrast  Result Date: 03/26/2021 CLINICAL DATA:  PE suspected, low/intermediate prob, positive D-dimer Shortness of breath for 1 month, worse today. EXAM: CT ANGIOGRAPHY CHEST WITH CONTRAST TECHNIQUE: Multidetector CT imaging of the chest was performed using the standard protocol during bolus administration of intravenous contrast. Multiplanar CT image reconstructions and MIPs were obtained to evaluate the vascular anatomy. CONTRAST:  53mL OMNIPAQUE IOHEXOL 350 MG/ML SOLN COMPARISON:  Radiograph earlier today. FINDINGS: Cardiovascular: There are no filling defects within the pulmonary arteries to suggest pulmonary embolus. Mild atherosclerosis of the thoracic aorta. No dissection or acute aortic findings. No aneurysm. Conventional branching pattern from the aortic arch. The heart is normal in size there is no pericardial effusion. Mediastinum/Nodes: No enlarged mediastinal or hilar lymph nodes. No thyroid nodule. No esophageal wall thickening. Lungs/Pleura: Minor central bronchial thickening. The lungs are clear without focal airspace disease. No findings of pulmonary edema. No pleural fluid. No  suspicious nodule or mass.  Upper Abdomen: No acute or unexpected findings. Musculoskeletal: There are no acute or suspicious osseous abnormalities. Review of the MIP images confirms the above findings. IMPRESSION: 1. No pulmonary embolus. 2. Minor central bronchial thickening, can be seen with bronchitis or reactive airways disease. Aortic Atherosclerosis (ICD10-I70.0). Electronically Signed   By: Narda Rutherford M.D.   On: 03/26/2021 19:48    Procedures Procedures   Medications Ordered in ED Medications  iohexol (OMNIPAQUE) 350 MG/ML injection 50 mL (50 mLs Intravenous Contrast Given 03/26/21 1924)    ED Course  I have reviewed the triage vital signs and the nursing notes.  Pertinent labs & imaging results that were available during my care of the patient were reviewed by me and considered in my medical decision making (see chart for details).  Clinical Course as of 03/26/21 2109  Fri Mar 26, 2021  1956 CBC with Differential(!) No significant change in hgb from BL. No indication for further anemia workup. She has scheduled outpatient appt with gastroenterology whom she follows with [ZB]  1956 CT Angio Chest PE W and/or Wo Contrast No PE. Evidence suggestive of reactive airway disease which fits with her clinical presentation. Plan will be to d/c with an RX for MDI albuterol to use prn until PCP f/u. [ZB]    Clinical Course User Index [ZB] Ardeen Fillers, DO   MDM Rules/Calculators/A&P                          This is a largely healthy 70 year old female who presented with at least 1 month of sporadic episodes of shortness of breath. Episode today improved after albuterol administered by EMS. In the emergency department she was normotensive, afebrile, not hypoxic on room air, having no increased work of breathing.  Slightly tachycardic in the higher 90s. No chest pain with these episodes and they are not exertional making ACS less likely.  EKG as above without evidence of acute  ischemic changes.  Do not feel as though cardiac biomarkers are indicated at this time. No significant smoking history and no diagnosis of COPD or asthma.  This could be a reactive airway disease type picture however no evidence of an exacerbation right now. No history of and no physical exam findings that would suggest CHF though this was considered. Could be symptomatic anemia.  She has been anemic in the past.  Ordered for CBC to evaluate.  No reported melena.  Does have intermittent bright red blood per rectum associated with constipation that she relates to her hemorrhoids.  None of the bleeding episodes have been significant and she only describes drops of blood in the toilet and on the toilet paper when wiping.  Doubt brisk lower GI bleed. Would be atypical presentation for pneumonia however I am considering this as well.  No evidence of acute cardiopulmonary abnormality on her chest x-ray.  Ordered for COVID-19 test. No history of DVT or PE.  Was previously on estrogen replacement but not currently.  Low risk Wells criteria.  Ordered for D-dimer.  See clinical course above for further medical decision making.  Plan will be to treat as reactive airway disease with an albuterol MDI to use as needed at home for shortness of breath. Will hold off on steroids for now.  She will follow-up with her primary care doctor for further testing.  Final Clinical Impression(s) / ED Diagnoses Final diagnoses:  Mild intermittent reactive airway disease without complication  Anemia, unspecified type  Rx / DC Orders ED Discharge Orders          Ordered    albuterol (VENTOLIN HFA) 108 (90 Base) MCG/ACT inhaler  Every 6 hours PRN        03/26/21 2107             Ardeen Fillers, DO 03/26/21 2109    Derwood Kaplan, MD 03/27/21 2302

## 2021-03-26 NOTE — Discharge Instructions (Addendum)
My suspicion is that your shortness of breath is related to a type of reactive airway disease similar to asthma or COPD.  You will need further testing to determine the exact cause however I am prescribing you an albuterol inhaler to use as needed for shortness of breath. Please schedule an appointment with your primary care doctor for follow-up and further testing.

## 2021-03-26 NOTE — ED Triage Notes (Signed)
C/o SHOB x6mo, worse today. Called EMS, given albuterol tx & made it better. Pt denies cough, congestion, states she "gets really tired." Hx anemia, states she's had BRBPR recently.

## 2021-03-29 DIAGNOSIS — R0602 Shortness of breath: Secondary | ICD-10-CM | POA: Diagnosis not present

## 2021-04-06 ENCOUNTER — Encounter: Payer: Self-pay | Admitting: Pulmonary Disease

## 2021-04-12 DIAGNOSIS — E611 Iron deficiency: Secondary | ICD-10-CM | POA: Diagnosis not present

## 2021-04-12 DIAGNOSIS — R5383 Other fatigue: Secondary | ICD-10-CM | POA: Diagnosis not present

## 2021-04-12 DIAGNOSIS — Z6824 Body mass index (BMI) 24.0-24.9, adult: Secondary | ICD-10-CM | POA: Diagnosis not present

## 2021-04-12 DIAGNOSIS — R0602 Shortness of breath: Secondary | ICD-10-CM | POA: Diagnosis not present

## 2021-04-12 DIAGNOSIS — D649 Anemia, unspecified: Secondary | ICD-10-CM | POA: Diagnosis not present

## 2021-04-12 DIAGNOSIS — D509 Iron deficiency anemia, unspecified: Secondary | ICD-10-CM | POA: Diagnosis not present

## 2021-04-15 ENCOUNTER — Other Ambulatory Visit: Payer: Self-pay

## 2021-04-15 ENCOUNTER — Ambulatory Visit: Payer: BC Managed Care – PPO | Admitting: Pulmonary Disease

## 2021-04-15 ENCOUNTER — Encounter: Payer: Self-pay | Admitting: Pulmonary Disease

## 2021-04-15 VITALS — BP 132/70 | HR 73 | Ht 59.0 in | Wt 128.0 lb

## 2021-04-15 DIAGNOSIS — K922 Gastrointestinal hemorrhage, unspecified: Secondary | ICD-10-CM | POA: Diagnosis not present

## 2021-04-15 DIAGNOSIS — R0602 Shortness of breath: Secondary | ICD-10-CM

## 2021-04-15 DIAGNOSIS — K625 Hemorrhage of anus and rectum: Secondary | ICD-10-CM

## 2021-04-15 DIAGNOSIS — D509 Iron deficiency anemia, unspecified: Secondary | ICD-10-CM | POA: Diagnosis not present

## 2021-04-15 LAB — FOLATE: Folate: >24.4 ng/mL (ref >5.9)

## 2021-04-15 LAB — CBC
HCT: 29.5 % — ABNORMAL LOW (ref 36.0–46.0)
Hemoglobin: 9.5 g/dL — ABNORMAL LOW (ref 12.0–15.0)
MCHC: 32.1 g/dL (ref 30.0–36.0)
MCV: 75.3 fl — ABNORMAL LOW (ref 78.0–100.0)
Platelets: 220 K/uL (ref 150.0–400.0)
RBC: 3.92 Mil/uL (ref 3.87–5.11)
RDW: 16.5 % — ABNORMAL HIGH (ref 11.5–15.5)
WBC: 4.9 K/uL (ref 4.0–10.5)

## 2021-04-15 LAB — FERRITIN: Ferritin: 5.8 ng/mL — ABNORMAL LOW (ref 10.0–291.0)

## 2021-04-15 LAB — VITAMIN B12: Vitamin B-12: 403 pg/mL (ref 211–911)

## 2021-04-15 NOTE — Progress Notes (Signed)
Please let patient know that she is still anemic.  Her numbers are low.  She needs to follow-up with gastroenterology.  I think her appointment scheduled tomorrow.   Thanks,  BLI  Josephine Igo, DO [facility] 04/15/2021 4:36 PM

## 2021-04-15 NOTE — Progress Notes (Signed)
Synopsis: Referred in July 2022 for SOB by Lianne Morisarroll, Erin, PA-C  Subjective:   PATIENT ID: Yvonne [name] CancerEulalia R Chabot GENDER: female DOB: 11/13/1950, MRN: [id number]  Chief Complaint  Patient presents with   Consult    Shortness of breath for a few months    This is a 70 year old female, past medical history gastroesophageal reflux, lipidemia.  She was evaluated in the emergency department and admitted to the hospital for evaluation of shortness of breath.  She had a CTA of the chest that was negative for PE.  She had lab work that showed a microcytic anemia.  She has however upon further disclosure today ongoing rectal bleeding.  This been going on for several months.  She stated that she had blood work on Monday and the provider that called her let her know that she had low blood level counts and was referred to GI.  She actually has an [facility] to [facility].  Her symptoms from shortness of breath come on with exertion and she feels shaky.  If she rests these dissipate.  She has no other associated respiratory complaints with her shortness of breath.  No chest pain.  No wheezing cough or sputum production.   Past Medical History:  Diagnosis Date   Abnormal exercise tolerance test    Anemia    Chest pain    Diverticulosis    GERD (gastroesophageal reflux disease)    Hyperlipidemia      Family History  Problem Relation Age of Onset   Hypertension Mother        possible   Nephrolithiasis Mother    Diabetes Brother    Diabetes Sister      Past Surgical History:  Procedure Laterality Date   APPENDECTOMY     BIOPSY  10/17/2018   Procedure: BIOPSY;  Surgeon: Malissa Hippoehman, Najeeb U, MD;  Location: [facility];  Service: Endoscopy;;  gastric    COMBINED HYSTEROSCOPY DIAGNOSTIC / D&C  12/24/2010   ESOPHAGOGASTRODUODENOSCOPY N/A 10/17/2018   Procedure: ESOPHAGOGASTRODUODENOSCOPY (EGD);  Surgeon: Malissa Hippoehman, Najeeb U, MD;  Location: [facility];  Service:  Endoscopy;  Laterality: N/A;  2:55   EYE SURGERY     cosmetic to upper lids   MOLE REMOVAL  2019   TUBAL LIGATION      Social History   Socioeconomic History   Marital status: Married    Spouse name: Not on file   Number of children: Not on file   Years of education: Not on file   Highest education level: Not on file  Occupational History   Not on file  Tobacco Use   Smoking status: Former    Packs/day: 0.25    Types: Cigarettes    Start date: [id number]    Quit date: 03/12/1983    Years since quitting: 38.1   Smokeless tobacco: Never   Tobacco comments:    quit over 30 years ago,   Vaping Use   Vaping Use: Never used  Substance and Sexual Activity   Alcohol use: Yes    Comment: rare beer   Drug use: No   Sexual activity: Yes    Birth control/protection: None    Comment: [employer] INSURANCE QUESTIONS,DES NEG  Other Topics Concern   Not on file  Social History Narrative   Not on file   Social Determinants of Health   Financial Resource Strain: Not on file  Food Insecurity: Not on file  Transportation Needs: Not on file  Physical Activity: Not on  file  Stress: Not on file  Social Connections: Not on file  Intimate Partner Violence: Not on file     Allergies  Allergen Reactions   Sulfa Antibiotics     Rash and itching and eyes were red   Tdap [Tetanus-Diphth-Acell Pertussis] Swelling     Outpatient Medications Prior to Visit  Medication Sig Dispense Refill   albuterol (VENTOLIN HFA) 108 (90 Base) MCG/ACT inhaler Inhale 1-2 puffs into the lungs every 6 (six) hours as needed for wheezing or shortness of breath. 1 each 0   aspirin EC 81 MG tablet Take 81 mg by mouth daily. Swallow whole.     cetirizine (ZYRTEC) 10 MG tablet Take 10 mg by mouth daily.     DEXILANT 60 MG capsule TAKE 1 CAPSULE BY MOUTH EVERY DAY (Patient taking differently: Take 60 mg by mouth daily.) 90 capsule 3   fluticasone (FLONASE) 50 MCG/ACT nasal spray Place 2 sprays into both nostrils daily.      nitrofurantoin, macrocrystal-monohydrate, (MACROBID) 100 MG capsule Take 1 capsule (100 mg total) by mouth daily. Prophylaxis 90 capsule 4   Pediatric Multivitamins-Iron (FLINTSTONES PLUS IRON) chewable tablet Chew 1 tablet by mouth 2 (two) times daily.     predniSONE (DELTASONE) 20 MG tablet Take 40 mg by mouth daily. (Patient not taking: Reported on 04/15/2021)     No facility-administered medications prior to visit.    Review of Systems  Constitutional:  Positive for malaise/fatigue. Negative for chills, fever and weight loss.  HENT:  Negative for hearing loss, sore throat and tinnitus.   Eyes:  Negative for blurred vision and double vision.  Respiratory:  Positive for shortness of breath. Negative for cough, hemoptysis, sputum production, wheezing and stridor.   Cardiovascular:  Negative for chest pain, palpitations, orthopnea, leg swelling and PND.  Gastrointestinal:  Negative for abdominal pain, constipation, diarrhea, heartburn, nausea and vomiting.  Genitourinary:  Negative for dysuria, hematuria and urgency.  Musculoskeletal:  Negative for joint pain and myalgias.  Skin:  Negative for itching and rash.  Neurological:  Positive for weakness. Negative for dizziness, tingling and headaches.  Endo/Heme/Allergies:  Negative for environmental allergies. Does not bruise/bleed easily.  Psychiatric/Behavioral:  Negative for depression. The patient is not nervous/anxious and does not have insomnia.   All other systems reviewed and are negative.   Objective:  Physical Exam Vitals reviewed.  Constitutional:      General: She is not in acute distress.    Appearance: She is well-developed.  HENT:     Head: Normocephalic and atraumatic.  Eyes:     General: No scleral icterus.    Conjunctiva/sclera: Conjunctivae normal.     Pupils: Pupils are equal, round, and reactive to light.  Neck:     Vascular: No JVD.     Trachea: No tracheal deviation.  Cardiovascular:     Rate and Rhythm:  Normal rate and regular rhythm.     Heart sounds: Normal heart sounds. No murmur heard. Pulmonary:     Effort: Pulmonary effort is normal. No tachypnea, accessory muscle usage or respiratory distress.     Breath sounds: Normal breath sounds. No stridor. No wheezing, rhonchi or rales.  Abdominal:     General: Bowel sounds are normal. There is no distension.     Palpations: Abdomen is soft.     Tenderness: There is no abdominal tenderness.  Musculoskeletal:        General: No tenderness.     Cervical back: Neck supple.  Lymphadenopathy:  Cervical: No cervical adenopathy.  Skin:    General: Skin is warm and dry.     Capillary Refill: Capillary refill takes less than 2 seconds.     Findings: No rash.  Neurological:     Mental Status: She is alert and oriented to person, place, and time.  Psychiatric:        Behavior: Behavior normal.     Vitals:   04/15/21 0923  BP: 132/70  Pulse: 73  SpO2: 100%  Weight: 128 lb (58.1 kg)  Height: 4\' 11"  (1.499 m)   100% on RA BMI Readings from Last 3 Encounters:  04/15/21 25.85 kg/m  02/26/21 25.85 kg/m  12/03/19 24.82 kg/m   Wt Readings from Last 3 Encounters:  04/15/21 128 lb (58.1 kg)  02/26/21 128 lb (58.1 kg)  12/03/19 125 lb (56.7 kg)     CBC    Component Value Date/Time   WBC 7.0 03/26/2021 1552   RBC 3.76 (L) 03/26/2021 1552   HGB 9.1 (L) 03/26/2021 1552   HGB 12.7 09/09/2019 1015   HCT 29.3 (L) 03/26/2021 1552   HCT 40.0 09/09/2019 1015   PLT 333 03/26/2021 1552   PLT 210 09/09/2019 1015   MCV 77.9 (L) 03/26/2021 1552   MCV 87 09/09/2019 1015   MCH 24.2 (L) 03/26/2021 1552   MCHC 31.1 03/26/2021 1552   RDW 15.7 (H) 03/26/2021 1552   RDW 16.5 (H) 09/09/2019 1015   LYMPHSABS 1.4 03/26/2021 1552   LYMPHSABS 1.9 09/09/2019 1015   MONOABS 0.4 03/26/2021 1552   EOSABS 0.1 03/26/2021 1552   EOSABS 0.1 09/09/2019 1015   BASOSABS 0.0 03/26/2021 1552   BASOSABS 0.0 09/09/2019 1015    Chest Imaging: June 2022  CTA chest: No evidence of PE, lung parenchyma looks normal Mild bronchial changes: The patient's images have been independently reviewed by me.    Pulmonary Functions Testing Results: No flowsheet data found.  FeNO:   Pathology:   Echocardiogram:   Heart Catheterization:     Assessment & Plan:     ICD-10-CM   1. SOB (shortness of breath)  R06.02     2. Rectal bleeding  K62.5 CT ABDOMEN PELVIS W WO CONTRAST    Ambulatory referral to Gastroenterology    3. Gastrointestinal hemorrhage, unspecified gastrointestinal hemorrhage type  K92.2 CT ABDOMEN PELVIS W WO CONTRAST    Ambulatory referral to Gastroenterology    4. Microcytic anemia  D50.9 Ambulatory referral to Gastroenterology    CBC    Vitamin B12    Folate    Iron and TIBC    Ferritin    CBC      Discussion:  This is a 70 year old female with complaints of shortness of breath, she has a microcytic anemia and chronic rectal bleeding that has not been worked up.  She is not had a colonoscopy in several years.  I am concerned that her shortness of breath is likely related to her anemia.  She was told lab work that she received on Monday that her hemoglobin was still low.  I cannot see these results they were completed at her primary care office.  She was however referred to gastroenterology and she has an appointment tomorrow.  Plan: I have ordered repeat labs today to see what her hemoglobin is. If it is really low she may need to consider going to the hospital for further evaluation. I have ordered a CT abdomen pelvis to look for potential any underlying mass. I suspect she will need  colonoscopy. We will contact patient with results of lab work today.  We appreciate the consultation   Current Outpatient Medications:    albuterol (VENTOLIN HFA) 108 (90 Base) MCG/ACT inhaler, Inhale 1-2 puffs into the lungs every 6 (six) hours as needed for wheezing or shortness of breath., Disp: 1 each, Rfl: 0   aspirin EC 81 MG  tablet, Take 81 mg by mouth daily. Swallow whole., Disp: , Rfl:    cetirizine (ZYRTEC) 10 MG tablet, Take 10 mg by mouth daily., Disp: , Rfl:    DEXILANT 60 MG capsule, TAKE 1 CAPSULE BY MOUTH EVERY DAY (Patient taking differently: Take 60 mg by mouth daily.), Disp: 90 capsule, Rfl: 3   fluticasone (FLONASE) 50 MCG/ACT nasal spray, Place 2 sprays into both nostrils daily., Disp: , Rfl:    nitrofurantoin, macrocrystal-monohydrate, (MACROBID) 100 MG capsule, Take 1 capsule (100 mg total) by mouth daily. Prophylaxis, Disp: 90 capsule, Rfl: 4   Pediatric Multivitamins-Iron (FLINTSTONES PLUS IRON) chewable tablet, Chew 1 tablet by mouth 2 (two) times daily., Disp: , Rfl:    predniSONE (DELTASONE) 20 MG tablet, Take 40 mg by mouth daily. (Patient not taking: Reported on 04/15/2021), Disp: , Rfl:    Josephine Igo, DO  Pulmonary Critical Care 04/15/2021 9:45 AM

## 2021-04-15 NOTE — Patient Instructions (Addendum)
Thank you for visiting Dr. Tonia Brooms at [facility]. Today we recommend the following:  Orders Placed This Encounter  Procedures   CT ABDOMEN PELVIS W WO CONTRAST   Ambulatory referral to Gastroenterology   We will get you scheduled to see GI shortly.   Return in about 3 months (around 07/16/2021), or if symptoms worsen or fail to improve, for with APP or Dr. Tonia Brooms.    Please do your part to reduce the spread of COVID-19.

## 2021-04-16 ENCOUNTER — Telehealth: Payer: Self-pay | Admitting: Gastroenterology

## 2021-04-16 ENCOUNTER — Telehealth: Payer: Self-pay

## 2021-04-16 DIAGNOSIS — Z7982 Long term (current) use of aspirin: Secondary | ICD-10-CM | POA: Diagnosis not present

## 2021-04-16 DIAGNOSIS — R0602 Shortness of breath: Secondary | ICD-10-CM | POA: Diagnosis not present

## 2021-04-16 DIAGNOSIS — Z6826 Body mass index (BMI) 26.0-26.9, adult: Secondary | ICD-10-CM | POA: Diagnosis not present

## 2021-04-16 DIAGNOSIS — K219 Gastro-esophageal reflux disease without esophagitis: Secondary | ICD-10-CM | POA: Diagnosis not present

## 2021-04-16 DIAGNOSIS — D649 Anemia, unspecified: Secondary | ICD-10-CM | POA: Diagnosis not present

## 2021-04-16 DIAGNOSIS — K625 Hemorrhage of anus and rectum: Secondary | ICD-10-CM | POA: Diagnosis not present

## 2021-04-16 DIAGNOSIS — E559 Vitamin D deficiency, unspecified: Secondary | ICD-10-CM | POA: Diagnosis not present

## 2021-04-16 DIAGNOSIS — I700 Atherosclerosis of aorta: Secondary | ICD-10-CM | POA: Diagnosis not present

## 2021-04-16 DIAGNOSIS — E785 Hyperlipidemia, unspecified: Secondary | ICD-10-CM | POA: Diagnosis not present

## 2021-04-16 DIAGNOSIS — K922 Gastrointestinal hemorrhage, unspecified: Secondary | ICD-10-CM | POA: Diagnosis not present

## 2021-04-16 DIAGNOSIS — D500 Iron deficiency anemia secondary to blood loss (chronic): Secondary | ICD-10-CM | POA: Diagnosis not present

## 2021-04-16 LAB — IRON AND TIBC
Iron Saturation: 3 % — CL (ref 15–55)
Iron: 17 ug/dL — ABNORMAL LOW (ref 27–139)
Total Iron Binding Capacity: 498 ug/dL — ABNORMAL HIGH (ref 250–450)
UIBC: 481 ug/dL — ABNORMAL HIGH (ref 118–369)

## 2021-04-16 NOTE — Telephone Encounter (Signed)
Informed patient of results and recommendations. 

## 2021-04-16 NOTE — Progress Notes (Signed)
Let patient know that she has very low iron levels. If she has chest pains or worsening SOB or increased GI bleeding she needs to be seen in ED. Dr. Meridee Score at [facility] was going to try to work her in ASAP.   Thanks,  BLI  Josephine Igo, DO [facility] 04/16/2021 1:53 PM

## 2021-04-16 NOTE — Telephone Encounter (Signed)
Dr. Tonia Brooms and I discussed this patient briefly. He is evaluating her for shortness of breath but the patient has had evidence of anemia and rectal bleeding and microcytosis suggestive and concerning for iron deficiency. Patient has not had a GI work-up previously. We would like to see if we can expedite things for the patient in the course of the next few weeks.  Patty, please reach out to the patient and see if the patient would like to move forward with the following work-up/evaluation: 1) patient clinic visit on 7/22 at 10:50 AM or clinic visit on week of August 1 (okay to overbook). 2) EGD/colonoscopy on August 9 or August 10 or August 12  Patient has option to keep her current GI appointment in [place] if she decides.  In either case, let Dr. Tonia Brooms and I know what she decides.  Thanks. GM

## 2021-04-16 NOTE — Telephone Encounter (Signed)
I called the pt and she states she has an appointment with Dr Loreta Ave for next Thursday.  She prefers to see Dr Loreta Ave because that is who she has had her previous colonoscopies with.

## 2021-04-16 NOTE — Telephone Encounter (Signed)
-----   Message from Josephine Igo, DO sent at 04/15/2021  4:37 PM EDT ----- Please let patient know that she is still anemic.  Her numbers are low.  She needs to follow-up with gastroenterology.  I think her appointment scheduled tomorrow.   Thanks,  BLI  Josephine Igo, DO [facility] 04/15/2021 4:36 PM

## 2021-04-22 DIAGNOSIS — D509 Iron deficiency anemia, unspecified: Secondary | ICD-10-CM | POA: Diagnosis not present

## 2021-04-22 DIAGNOSIS — K219 Gastro-esophageal reflux disease without esophagitis: Secondary | ICD-10-CM | POA: Diagnosis not present

## 2021-04-22 DIAGNOSIS — K625 Hemorrhage of anus and rectum: Secondary | ICD-10-CM | POA: Diagnosis not present

## 2021-04-22 DIAGNOSIS — K5904 Chronic idiopathic constipation: Secondary | ICD-10-CM | POA: Diagnosis not present

## 2021-04-30 ENCOUNTER — Other Ambulatory Visit: Payer: BC Managed Care – PPO

## 2021-05-03 ENCOUNTER — Ambulatory Visit (INDEPENDENT_AMBULATORY_CARE_PROVIDER_SITE_OTHER): Payer: BC Managed Care – PPO | Admitting: Gastroenterology

## 2021-05-05 DIAGNOSIS — D509 Iron deficiency anemia, unspecified: Secondary | ICD-10-CM | POA: Diagnosis not present

## 2021-05-05 DIAGNOSIS — Z1211 Encounter for screening for malignant neoplasm of colon: Secondary | ICD-10-CM | POA: Diagnosis not present

## 2021-05-05 DIAGNOSIS — K317 Polyp of stomach and duodenum: Secondary | ICD-10-CM | POA: Diagnosis not present

## 2021-05-05 DIAGNOSIS — K625 Hemorrhage of anus and rectum: Secondary | ICD-10-CM | POA: Diagnosis not present

## 2021-05-11 ENCOUNTER — Institutional Professional Consult (permissible substitution): Payer: BC Managed Care – PPO | Admitting: Pulmonary Disease

## 2021-05-14 DIAGNOSIS — R5383 Other fatigue: Secondary | ICD-10-CM | POA: Diagnosis not present

## 2021-05-14 DIAGNOSIS — D649 Anemia, unspecified: Secondary | ICD-10-CM | POA: Diagnosis not present

## 2021-05-14 DIAGNOSIS — R0602 Shortness of breath: Secondary | ICD-10-CM | POA: Diagnosis not present

## 2021-05-14 DIAGNOSIS — R0789 Other chest pain: Secondary | ICD-10-CM | POA: Diagnosis not present

## 2021-05-19 ENCOUNTER — Telehealth: Payer: Self-pay | Admitting: Pulmonary Disease

## 2021-05-19 NOTE — Telephone Encounter (Signed)
Called and spoke with patient's husband. He stated that she is in the process of applying for short term disability and [employer] is asking for a copy of her recent OV. She has given [place] permission to fax it to attn [facility]. I advised him that I would go ahead and fax the records for them. He verbalized understanding.   Nothing further needed at time of call.

## 2021-06-03 DIAGNOSIS — D509 Iron deficiency anemia, unspecified: Secondary | ICD-10-CM | POA: Diagnosis not present

## 2021-06-03 DIAGNOSIS — K219 Gastro-esophageal reflux disease without esophagitis: Secondary | ICD-10-CM | POA: Diagnosis not present

## 2021-06-03 DIAGNOSIS — K5904 Chronic idiopathic constipation: Secondary | ICD-10-CM | POA: Diagnosis not present

## 2021-06-10 ENCOUNTER — Other Ambulatory Visit: Payer: Self-pay

## 2021-06-10 ENCOUNTER — Ambulatory Visit: Payer: BC Managed Care – PPO | Admitting: Pulmonary Disease

## 2021-06-10 ENCOUNTER — Encounter: Payer: Self-pay | Admitting: Pulmonary Disease

## 2021-06-10 VITALS — BP 122/64 | HR 67 | Temp 98.1°F | Ht 59.0 in | Wt 132.6 lb

## 2021-06-10 DIAGNOSIS — R0602 Shortness of breath: Secondary | ICD-10-CM | POA: Diagnosis not present

## 2021-06-10 DIAGNOSIS — D509 Iron deficiency anemia, unspecified: Secondary | ICD-10-CM | POA: Diagnosis not present

## 2021-06-10 MED ORDER — FLUTICASONE FUROATE-VILANTEROL 100-25 MCG/INH IN AEPB: 1.0000 | INHALATION_SPRAY | Freq: Every day | RESPIRATORY_TRACT | 0 refills | Status: DC

## 2021-06-10 NOTE — Patient Instructions (Addendum)
Thank you for visiting Dr. Tonia Brooms at [facility]. Today we recommend the following:  Orders Placed This Encounter  Procedures   Pulmonary Function Test   Breo Samples  Continue albuterol as needed   Return in about 5 weeks (around 07/15/2021) for with APP.    Please do your part to reduce the spread of COVID-19.

## 2021-06-10 NOTE — Progress Notes (Signed)
Synopsis: Referred in July 2022 for SOB by Lanelle Bal, PA-C  Subjective:   PATIENT ID: Yvonne [name] GENDER: female DOB: Feb 25, 1951, MRN: [id number]  Chief Complaint  Patient presents with   Follow-up    Pt states she has been doing better since last visit but states that she still becomes SOB with exertion.    This is a 70 year old female, past medical history gastroesophageal reflux, lipidemia.  She was evaluated in the emergency department and admitted to the hospital for evaluation of shortness of breath.  She had a CTA of the chest that was negative for PE.  She had lab work that showed a microcytic anemia.  She has however upon further disclosure today ongoing rectal bleeding.  This been going on for several months.  She stated that she had blood work on Monday and the provider that called her let her know that she had low blood level counts and was referred to GI.  She actually has an appointment to see gastroenterologist tomorrow at [facility].  Her symptoms from shortness of breath come on with exertion and she feels shaky.  If she rests these dissipate.  She has no other associated respiratory complaints with her shortness of breath.  No chest pain.  No wheezing cough or sputum production.  OV 06/10/2021: Here today for follow-up.  Was initially seen for shortness of breath evaluation.  She had a CTA of the chest that was negative.  Found to have a microcytic anemia.  Underwent endoscopy recently.  Now has a referral to see hematology.  She is seeing them next week.  She still feels short of breath with exertion.  She really describes 2 scenarios of shortness of breath 1 that comes on after having this left-sided posterior subcapsular chest pain that she can point to that feels like it wraps around the left side of her chest she feels like it catches her breath and she has to sit down and rest.  Additionally she states that she has random times that she feels short of breath that she just  has to sit down because she can feel like she cannot go on.  Not really associated with anything.  Denies wheezing cough sputum production.   Past Medical History:  Diagnosis Date   Abnormal exercise tolerance test    Anemia    Chest pain    Diverticulosis    GERD (gastroesophageal reflux disease)    Hyperlipidemia      Family History  Problem Relation Age of Onset   Hypertension Mother        possible   Nephrolithiasis Mother    Diabetes Brother    Diabetes Sister      Past Surgical History:  Procedure Laterality Date   APPENDECTOMY     BIOPSY  10/17/2018   Procedure: BIOPSY;  Surgeon: Rogene Houston, MD;  Location: [facility];  Service: Endoscopy;;  gastric    COMBINED HYSTEROSCOPY DIAGNOSTIC / D&C  12/24/2010   ESOPHAGOGASTRODUODENOSCOPY N/A 10/17/2018   Procedure: ESOPHAGOGASTRODUODENOSCOPY (EGD);  Surgeon: Rogene Houston, MD;  Location: [facility];  Service: Endoscopy;  Laterality: N/A;  2:55   EYE SURGERY     cosmetic to upper lids   MOLE REMOVAL  2019   TUBAL LIGATION      Social History   Socioeconomic History   Marital status: Married    Spouse name: Not on file   Number of children: Not on file   Years of education: Not  on file   Highest education level: Not on file  Occupational History   Not on file  Tobacco Use   Smoking status: Former    Packs/day: 0.50    Years: 10.00    Pack years: 5.00    Types: Cigarettes    Start date: 38    Quit date: 03/12/1983    Years since quitting: 38.2   Smokeless tobacco: Never  Vaping Use   Vaping Use: Never used  Substance and Sexual Activity   Alcohol use: Yes    Comment: rare beer   Drug use: No   Sexual activity: Yes    Birth control/protection: None    Comment: DEC[employer]ED INSURANCE QUESTIONS,DES NEG  Other Topics Concern   Not on file  Social History Narrative   Not on file   Social Determinants of Health   Financial Resource Strain: Not on file  Food Insecurity: Not on file   Transportation Needs: Not on file  Physical Activity: Not on file  Stress: Not on file  Social Connections: Not on file  Intimate Partner Violence: Not on file     Allergies  Allergen Reactions   Sulfa Antibiotics     Rash and itching and eyes were red   Tdap [Tetanus-Diphth-Acell Pertussis] Swelling     Outpatient Medications Prior to Visit  Medication Sig Dispense Refill   albuterol (VENTOLIN HFA) 108 (90 Base) MCG/ACT inhaler      aspirin EC 81 MG tablet Take 81 mg by mouth daily. Swallow whole.     cetirizine (ZYRTEC) 10 MG tablet Take 10 mg by mouth daily.     DEXILANT 60 MG capsule TAKE 1 CAPSULE BY MOUTH EVERY DAY (Patient taking differently: Take 60 mg by mouth daily.) 90 capsule 3   ferrous sulfate 325 (65 FE) MG EC tablet Take 1 tablet by mouth 3 (three) times daily.     fluticasone (FLONASE) 50 MCG/ACT nasal spray Place 2 sprays into both nostrils daily.     folic acid (FOLVITE) 1 MG tablet Take 1 mg by mouth every morning.     nitrofurantoin, macrocrystal-monohydrate, (MACROBID) 100 MG capsule Take 1 capsule (100 mg total) by mouth daily. Prophylaxis 90 capsule 4   Pediatric Multivitamins-Iron (FLINTSTONES PLUS IRON) chewable tablet Chew 1 tablet by mouth 2 (two) times daily.     albuterol (VENTOLIN HFA) 108 (90 Base) MCG/ACT inhaler Inhale 1-2 puffs into the lungs every 6 (six) hours as needed for wheezing or shortness of breath. 1 each 0   predniSONE (DELTASONE) 20 MG tablet Take 40 mg by mouth daily. (Patient not taking: Reported on 04/15/2021)     No facility-administered medications prior to visit.    Review of Systems  Constitutional:  Negative for chills, fever, malaise/fatigue and weight loss.  HENT:  Negative for hearing loss, sore throat and tinnitus.   Eyes:  Negative for blurred vision and double vision.  Respiratory:  Positive for shortness of breath. Negative for cough, hemoptysis, sputum production, wheezing and stridor.   Cardiovascular:  Negative for  chest pain, palpitations, orthopnea, leg swelling and PND.  Gastrointestinal:  Negative for abdominal pain, constipation, diarrhea, heartburn, nausea and vomiting.  Genitourinary:  Positive for flank pain. Negative for dysuria, hematuria and urgency.  Musculoskeletal:  Negative for joint pain and myalgias.  Skin:  Negative for itching and rash.  Neurological:  Negative for dizziness, tingling, weakness and headaches.  Endo/Heme/Allergies:  Negative for environmental allergies. Does not bruise/bleed easily.  Psychiatric/Behavioral:  Negative for depression. The  patient is not nervous/anxious and does not have insomnia.   All other systems reviewed and are negative.   Objective:  Physical Exam Vitals reviewed.  Constitutional:      General: She is not in acute distress.    Appearance: She is well-developed.  HENT:     Head: Normocephalic and atraumatic.  Eyes:     General: No scleral icterus.    Conjunctiva/sclera: Conjunctivae normal.     Pupils: Pupils are equal, round, and reactive to light.  Neck:     Vascular: No JVD.     Trachea: No tracheal deviation.  Cardiovascular:     Rate and Rhythm: Normal rate and regular rhythm.     Heart sounds: Normal heart sounds. No murmur heard. Pulmonary:     Effort: Pulmonary effort is normal. No tachypnea, accessory muscle usage or respiratory distress.     Breath sounds: No stridor. No wheezing, rhonchi or rales.  Abdominal:     General: Bowel sounds are normal. There is no distension.     Palpations: Abdomen is soft.     Tenderness: There is no abdominal tenderness.  Musculoskeletal:        General: No tenderness.     Cervical back: Neck supple.  Lymphadenopathy:     Cervical: No cervical adenopathy.  Skin:    General: Skin is warm and dry.     Capillary Refill: Capillary refill takes less than 2 seconds.     Findings: No rash.  Neurological:     Mental Status: She is alert and oriented to person, place, and time.  Psychiatric:         Behavior: Behavior normal.     Vitals:   06/10/21 1146  BP: 122/64  Pulse: 67  Temp: 98.1 F (36.7 C)  TempSrc: Oral  SpO2: 100%  Weight: 132 lb 9.6 oz (60.1 kg)  Height: '4\' 11"'  (1.499 m)   100% on RA BMI Readings from Last 3 Encounters:  06/10/21 26.78 kg/m  04/15/21 25.85 kg/m  02/26/21 25.85 kg/m   Wt Readings from Last 3 Encounters:  06/10/21 132 lb 9.6 oz (60.1 kg)  04/15/21 128 lb (58.1 kg)  02/26/21 128 lb (58.1 kg)     CBC    Component Value Date/Time   WBC 4.9 04/15/2021 1011   RBC 3.92 04/15/2021 1011   HGB 9.5 (L) 04/15/2021 1011   HGB 12.7 09/09/2019 1015   HCT 29.5 (L) 04/15/2021 1011   HCT 40.0 09/09/2019 1015   PLT 220.0 04/15/2021 1011   PLT 210 09/09/2019 1015   MCV 75.3 (L) 04/15/2021 1011   MCV 87 09/09/2019 1015   MCH 24.2 (L) 03/26/2021 1552   MCHC 32.1 04/15/2021 1011   RDW 16.5 (H) 04/15/2021 1011   RDW 16.5 (H) 09/09/2019 1015   LYMPHSABS 1.4 03/26/2021 1552   LYMPHSABS 1.9 09/09/2019 1015   MONOABS 0.4 03/26/2021 1552   EOSABS 0.1 03/26/2021 1552   EOSABS 0.1 09/09/2019 1015   BASOSABS 0.0 03/26/2021 1552   BASOSABS 0.0 09/09/2019 1015    Chest Imaging: June 2022 CTA chest: No evidence of PE, lung parenchyma looks normal Mild bronchial changes: The patient's images have been independently reviewed by me.    Pulmonary Functions Testing Results: No flowsheet data found.  FeNO:   Pathology:   Echocardiogram:   Heart Catheterization:     Assessment & Plan:     ICD-10-CM   1. SOB (shortness of breath)  R06.02 Pulmonary Function Test    2.  Microcytic anemia  D50.9       Discussion:  This is a 70 year old female complaints of shortness of breath.  Her symptoms have not significantly improved or changed that much since we last met.  I still think a large portion of her shortness of breath is related to her anemia.  Additionally, I think she is very anxious and has what appears to be small panic attacks where  she feels short of breath when she sits or rests and the symptoms dissipate.  Plan: Will get full pulmonary function tests. I counseled her on the fact that everything so far looks reassuring. Her CT scan of the chest also appears reassuring. We will give her samples of a maintenance inhaler Breo 100. Will see if this makes any difference in her symptoms. Continue albuterol as needed. Return to clinic in a few weeks after PFTs complete.    Current Outpatient Medications:    albuterol (VENTOLIN HFA) 108 (90 Base) MCG/ACT inhaler, , Disp: , Rfl:    aspirin EC 81 MG tablet, Take 81 mg by mouth daily. Swallow whole., Disp: , Rfl:    cetirizine (ZYRTEC) 10 MG tablet, Take 10 mg by mouth daily., Disp: , Rfl:    DEXILANT 60 MG capsule, TAKE 1 CAPSULE BY MOUTH EVERY DAY (Patient taking differently: Take 60 mg by mouth daily.), Disp: 90 capsule, Rfl: 3   ferrous sulfate 325 (65 FE) MG EC tablet, Take 1 tablet by mouth 3 (three) times daily., Disp: , Rfl:    fluticasone (FLONASE) 50 MCG/ACT nasal spray, Place 2 sprays into both nostrils daily., Disp: , Rfl:    folic acid (FOLVITE) 1 MG tablet, Take 1 mg by mouth every morning., Disp: , Rfl:    nitrofurantoin, macrocrystal-monohydrate, (MACROBID) 100 MG capsule, Take 1 capsule (100 mg total) by mouth daily. Prophylaxis, Disp: 90 capsule, Rfl: 4   Pediatric Multivitamins-Iron (FLINTSTONES PLUS IRON) chewable tablet, Chew 1 tablet by mouth 2 (two) times daily., Disp: , Rfl:    albuterol (VENTOLIN HFA) 108 (90 Base) MCG/ACT inhaler, Inhale 1-2 puffs into the lungs every 6 (six) hours as needed for wheezing or shortness of breath., Disp: 1 each, Rfl: 0   Garner Nash, DO [facility] 06/10/2021 11:55 AM

## 2021-06-10 NOTE — Addendum Note (Signed)
Addended by: Wyvonne Lenz on: 06/10/2021 12:22 PM   Modules accepted: Orders

## 2021-06-15 DIAGNOSIS — D649 Anemia, unspecified: Secondary | ICD-10-CM | POA: Diagnosis not present

## 2021-06-15 DIAGNOSIS — K625 Hemorrhage of anus and rectum: Secondary | ICD-10-CM | POA: Diagnosis not present

## 2021-06-15 DIAGNOSIS — D500 Iron deficiency anemia secondary to blood loss (chronic): Secondary | ICD-10-CM | POA: Diagnosis not present

## 2021-06-15 DIAGNOSIS — E559 Vitamin D deficiency, unspecified: Secondary | ICD-10-CM | POA: Diagnosis not present

## 2021-06-18 DIAGNOSIS — D500 Iron deficiency anemia secondary to blood loss (chronic): Secondary | ICD-10-CM | POA: Diagnosis not present

## 2021-06-18 DIAGNOSIS — K625 Hemorrhage of anus and rectum: Secondary | ICD-10-CM | POA: Diagnosis not present

## 2021-06-18 DIAGNOSIS — D509 Iron deficiency anemia, unspecified: Secondary | ICD-10-CM | POA: Diagnosis not present

## 2021-07-19 ENCOUNTER — Ambulatory Visit: Payer: BC Managed Care – PPO | Admitting: Acute Care

## 2022-03-02 ENCOUNTER — Other Ambulatory Visit (HOSPITAL_COMMUNITY)
Admission: RE | Admit: 2022-03-02 | Discharge: 2022-03-02 | Disposition: A | Discharge status: Home / Self Care | Payer: Medicare Other | Source: Ambulatory Visit | Attending: Obstetrics & Gynecology | Admitting: Obstetrics & Gynecology

## 2022-03-02 ENCOUNTER — Ambulatory Visit (INDEPENDENT_AMBULATORY_CARE_PROVIDER_SITE_OTHER): Payer: Medicare Other | Admitting: Obstetrics & Gynecology

## 2022-03-02 ENCOUNTER — Encounter: Payer: Self-pay | Admitting: Obstetrics & Gynecology

## 2022-03-02 VITALS — BP 118/74 | HR 63 | Resp 16 | Ht 59.5 in | Wt 130.0 lb

## 2022-03-02 DIAGNOSIS — Z780 Asymptomatic menopausal state: Secondary | ICD-10-CM

## 2022-03-02 DIAGNOSIS — N3090 Cystitis, unspecified without hematuria: Secondary | ICD-10-CM

## 2022-03-02 DIAGNOSIS — Z01419 Encounter for gynecological examination (general) (routine) without abnormal findings: Secondary | ICD-10-CM

## 2022-03-02 DIAGNOSIS — Z79890 Hormone replacement therapy (postmenopausal): Secondary | ICD-10-CM | POA: Diagnosis not present

## 2022-03-02 MED ORDER — NITROFURANTOIN MONOHYD MACRO 100 MG PO CAPS: 100.0000 mg | ORAL_CAPSULE | Freq: Every day | ORAL | 4 refills | Status: DC

## 2022-03-02 NOTE — Progress Notes (Signed)
Yvonne [name] 1951-08-08 [id number]   History:    71 y.o. G3P2A1 Married.     RP:  Established patient for annual gyn exam.   HPI:  Postmenopause, well on no HRT, except occasional Estrace cream use.  No PMB.  No pelvic pain.  Abstinent.  Pap Neg 12/2019. No h/o abnormal Pap. Pap reflex today.  Breasts wnl. Mammo Neg 12/2020, needs to schedule now. Urine normal.  No recurrence of cystitis on prophylactic MacroBID.  BMs normal.  BMI 25.82.  Walking regularly.  Health Labs with Fam MD. [facility] 2022.  BD normal 09/2018.   Past medical history,surgical history, family history and social history were all reviewed and documented in the EPIC chart.  Gynecologic History No LMP recorded. Patient is postmenopausal.  Obstetric History OB History  Gravida Para Term Preterm AB Living  3 2     1 2   SAB IAB Ectopic Multiple Live Births  1            # Outcome Date GA Lbr Len/2nd Weight Sex Delivery Anes PTL Lv  3 SAB           2 Para           1 Para              ROS: A ROS was performed and pertinent positives and negatives are included in the history.  GENERAL: No fevers or chills. HEENT: No change in vision, no earache, sore throat or sinus congestion. NECK: No pain or stiffness. CARDIOVASCULAR: No chest pain or pressure. No palpitations. PULMONARY: No shortness of breath, cough or wheeze. GASTROINTESTINAL: No abdominal pain, nausea, vomiting or diarrhea, melena or bright red blood per rectum. GENITOURINARY: No urinary frequency, urgency, hesitancy or dysuria. MUSCULOSKELETAL: No joint or muscle pain, no back pain, no recent trauma. DERMATOLOGIC: No rash, no itching, no lesions. ENDOCRINE: No polyuria, polydipsia, no heat or cold intolerance. No recent change in weight. HEMATOLOGICAL: No anemia or easy bruising or bleeding. NEUROLOGIC: No headache, seizures, numbness, tingling or weakness. PSYCHIATRIC: No depression, no loss of interest in normal activity or change in sleep pattern.      Exam:   BP 118/74   Pulse 63   Resp 16   Ht 4' 11.5" (1.511 m)   Wt 130 lb (59 kg)   BMI 25.82 kg/m   Body mass index is 25.82 kg/m.  General appearance : Well developed well nourished female. No acute distress HEENT: Eyes: no retinal hemorrhage or exudates,  Neck supple, trachea midline, no carotid bruits, no thyroidmegaly Lungs: Clear to auscultation, no rhonchi or wheezes, or rib retractions  Heart: Regular rate and rhythm, no murmurs or gallops Breast:Examined in sitting and supine position were symmetrical in appearance, no palpable masses or tenderness,  no skin retraction, no nipple inversion, no nipple discharge, no skin discoloration, no axillary or supraclavicular lymphadenopathy Abdomen: no palpable masses or tenderness, no rebound or guarding Extremities: no edema or skin discoloration or tenderness  Pelvic: Vulva: Normal             Vagina: No gross lesions or discharge  Cervix: No gross lesions or discharge.  Pap reflex done.  Uterus  AV, normal size, shape and consistency, non-tender and mobile  Adnexa  Without masses or tenderness  Anus: Normal   Assessment/Plan:  71 y.o. female for annual exam   1. Encounter for routine gynecological examination with Papanicolaou smear of cervix Postmenopause, well on no HRT, except occasional Estrace cream  use.  No PMB.  No pelvic pain.  Abstinent.  Pap Neg 12/2019. No h/o abnormal Pap. Pap reflex today.  Breasts wnl. Mammo Neg 12/2020, needs to schedule now. Urine normal.  No recurrence of cystitis on prophylactic MacroBID.  BMs normal.  BMI 25.82.  Walking regularly.  Health Labs with Fam MD. [facility] 2022.  BD normal 09/2018. - Cytology - PAP( [facility])  2. Postmenopausal Postmenopause, well on no HRT, except occasional Estrace cream use.  No PMB.  No pelvic pain.  Abstinent.   3. Recurrent cystitis No recurrence on MacroBID 100 mg 1 tab PO daily prophylaxis.  Prescription sent to pharmacy.  Other orders -  atorvastatin (LIPITOR) 20 MG tablet; Take 20 mg by mouth daily. - nitrofurantoin, macrocrystal-monohydrate, (MACROBID) 100 MG capsule; Take 1 capsule (100 mg total) by mouth daily. Prophylaxis   Princess Bruins MD, 9:20 AM 03/02/2022

## 2022-03-03 LAB — CYTOLOGY - PAP: Diagnosis: NEGATIVE

## 2022-03-14 ENCOUNTER — Other Ambulatory Visit (HOSPITAL_COMMUNITY): Payer: Self-pay | Admitting: Family Medicine

## 2022-03-14 DIAGNOSIS — Z1231 Encounter for screening mammogram for malignant neoplasm of breast: Secondary | ICD-10-CM

## 2022-03-17 ENCOUNTER — Ambulatory Visit (HOSPITAL_COMMUNITY)
Admission: RE | Admit: 2022-03-17 | Discharge: 2022-03-17 | Disposition: A | Discharge status: Home / Self Care | Payer: Medicare Other | Source: Ambulatory Visit | Attending: Family Medicine | Admitting: Family Medicine

## 2022-03-17 DIAGNOSIS — Z1231 Encounter for screening mammogram for malignant neoplasm of breast: Secondary | ICD-10-CM | POA: Diagnosis present

## 2022-04-08 IMAGING — CT CT ANGIO CHEST
2 of 6 series · 19 of 36 positions shown · IV contrast (omnipaque)
Comparison: Radiograph earlier today.

CLINICAL DATA: PE suspected, low/intermediate prob, positive
D-dimer

Shortness of breath for 1 month, worse today.
EXAM:
CT ANGIOGRAPHY CHEST WITH CONTRAST
TECHNIQUE: Multidetector CT imaging of the chest was performed using the
standard protocol during bolus administration of intravenous
contrast. Multiplanar CT image reconstructions and MIPs were
obtained to evaluate the vascular anatomy.
CONTRAST:  50mL OMNIPAQUE IOHEXOL 350 MG/ML SOLN

[Series 6: pe thins · axial · 0.64mm/px · z∈[-270,-42]mm · 18 of 364 slices shown]
[im 19/364  lung]
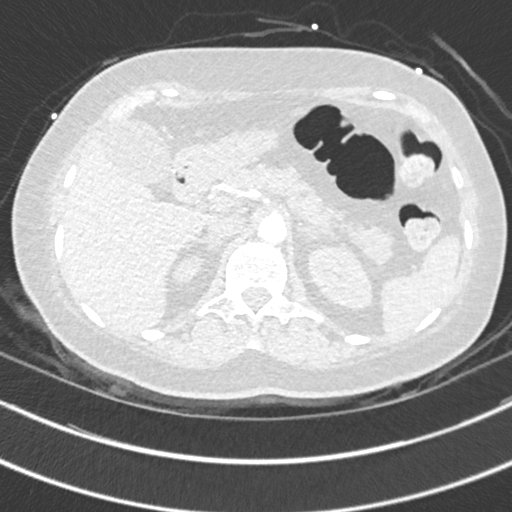
[im 37/364  mediastinal]
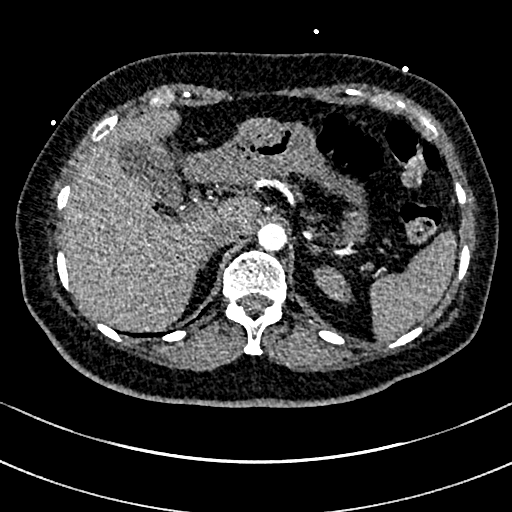
[im 55/364  lung]
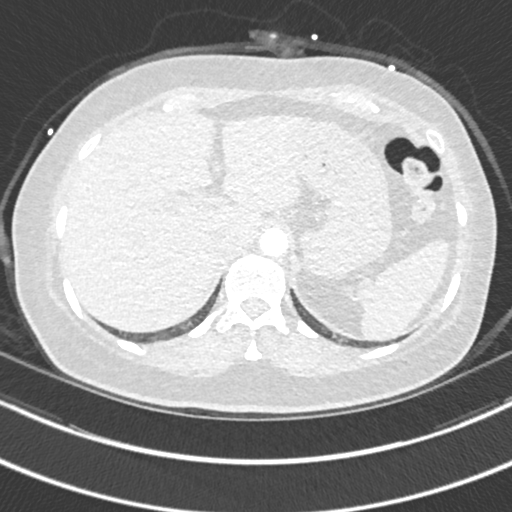
[im 73/364  mediastinal]
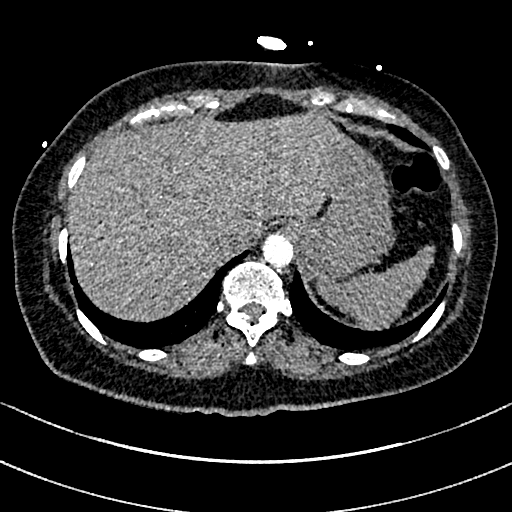
[im 91/364  lung]
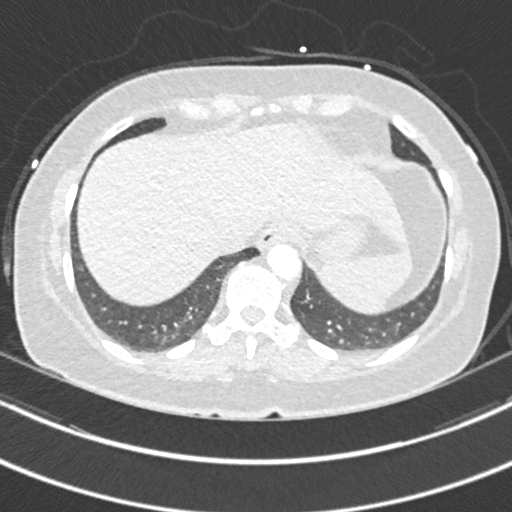
[im 109/364  mediastinal]
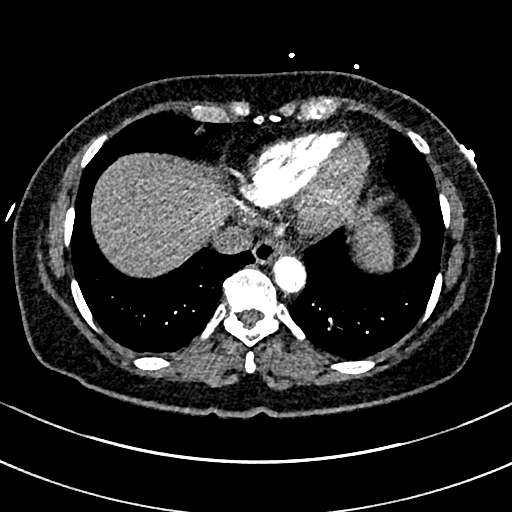
[im 128/364  lung]
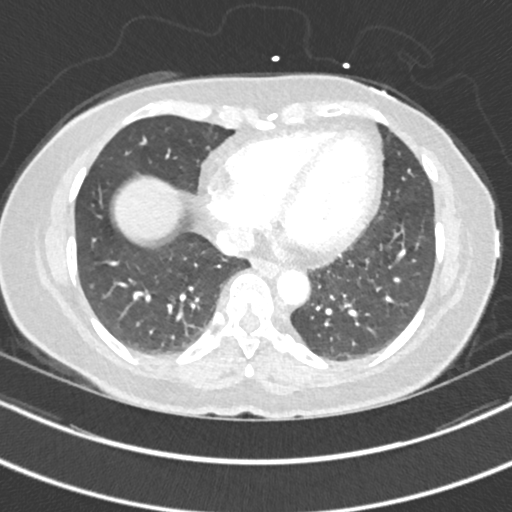
[im 146/364  mediastinal]
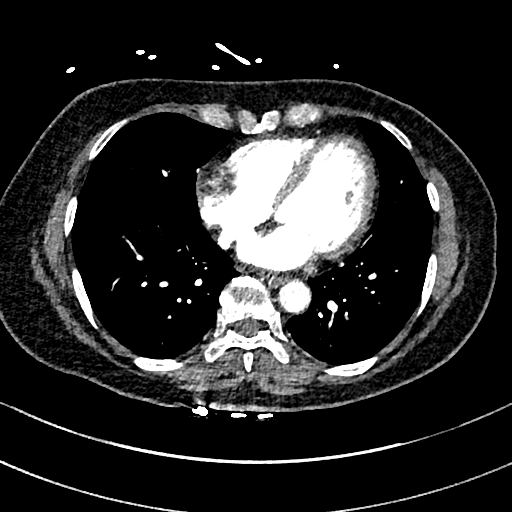
[im 164/364  lung]
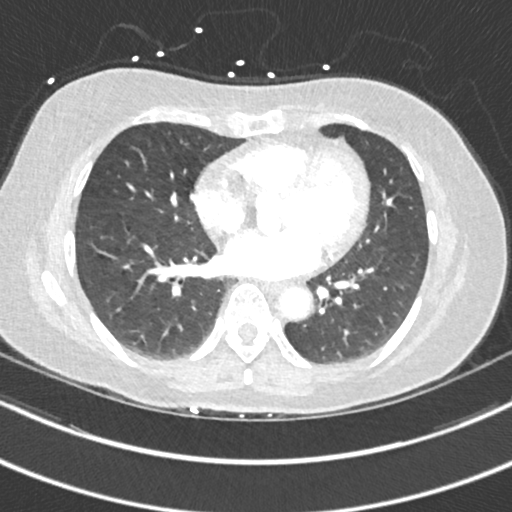
[im 200/364  mediastinal]
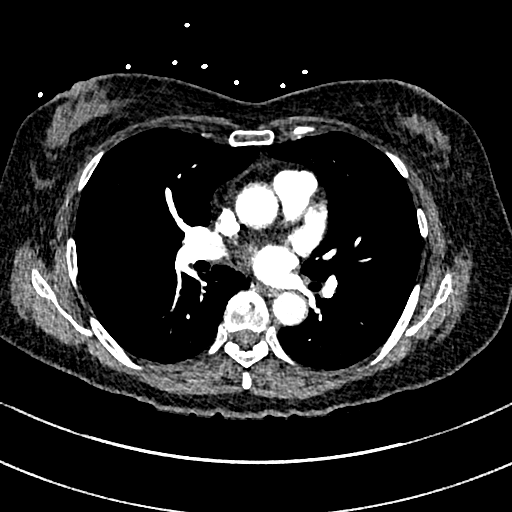
[im 218/364  lung]
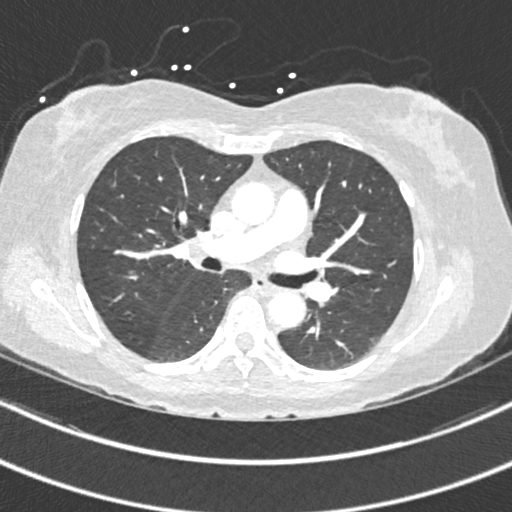
[im 236/364  mediastinal]
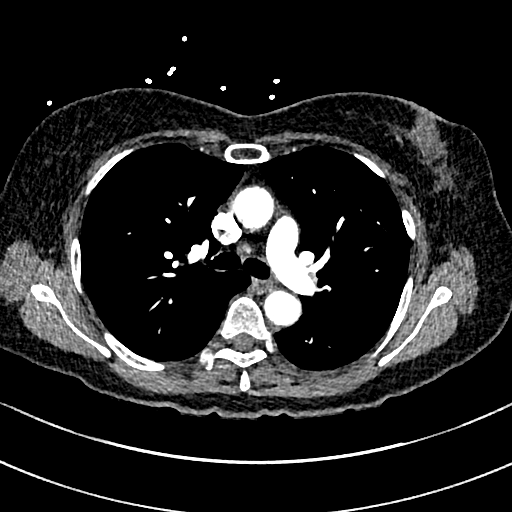
[im 255/364  lung]
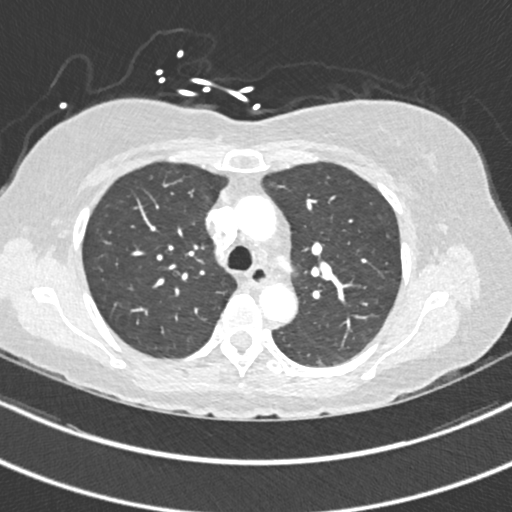
[im 273/364  mediastinal]
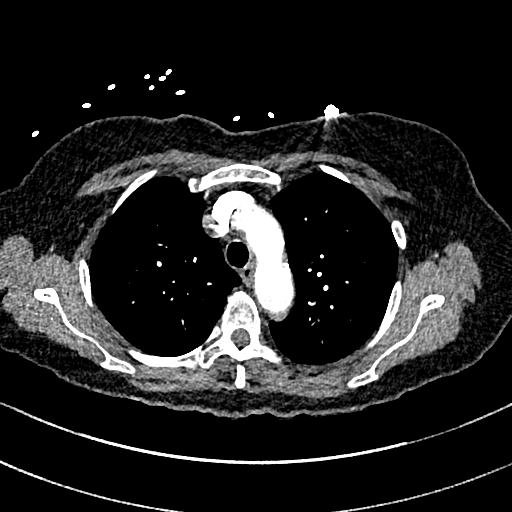
[im 291/364  lung]
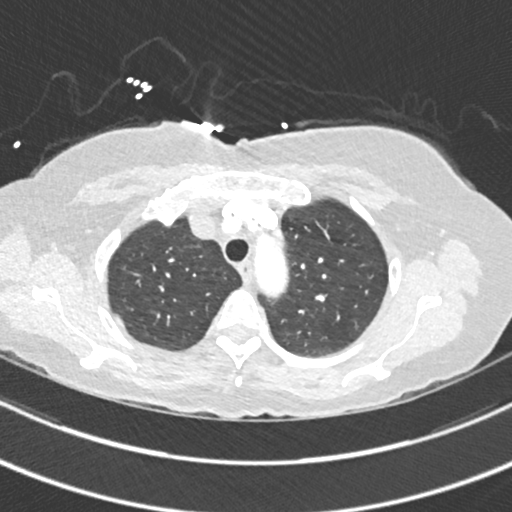
[im 309/364  mediastinal]
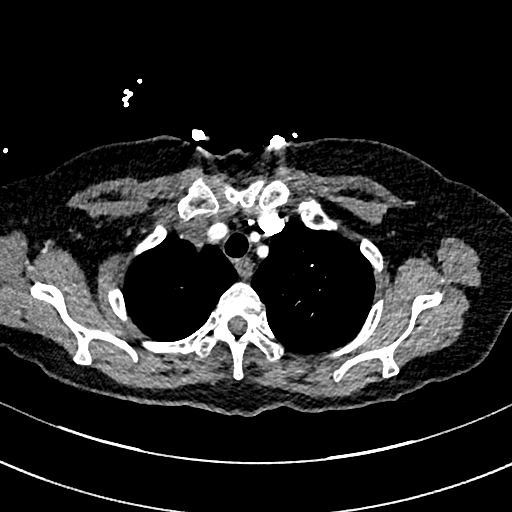
[im 327/364  lung]
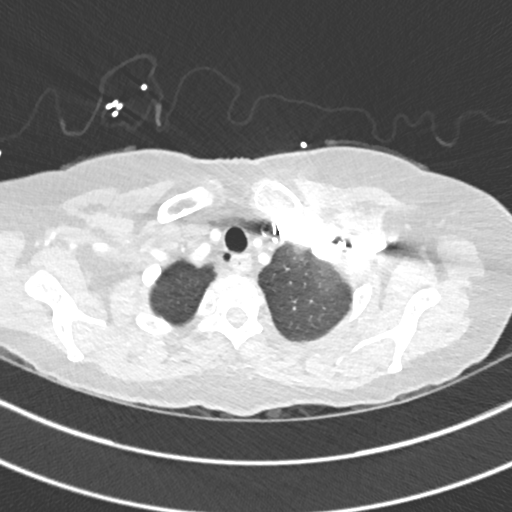
[im 345/364  mediastinal]
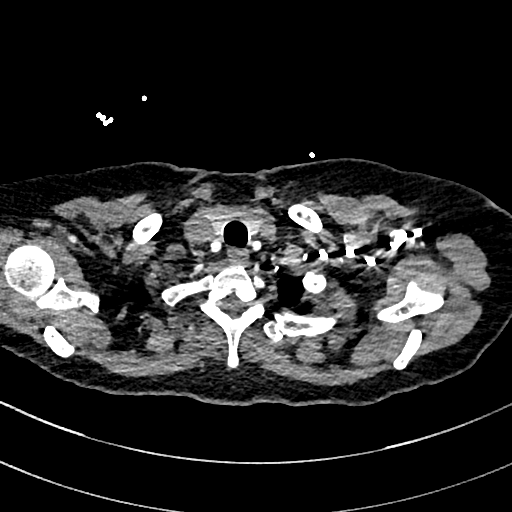

[Series 8: pe 2mm cor · coronal · 0.53mm/px · 1 of 149 slices shown]
[im 75/149  mediastinal]
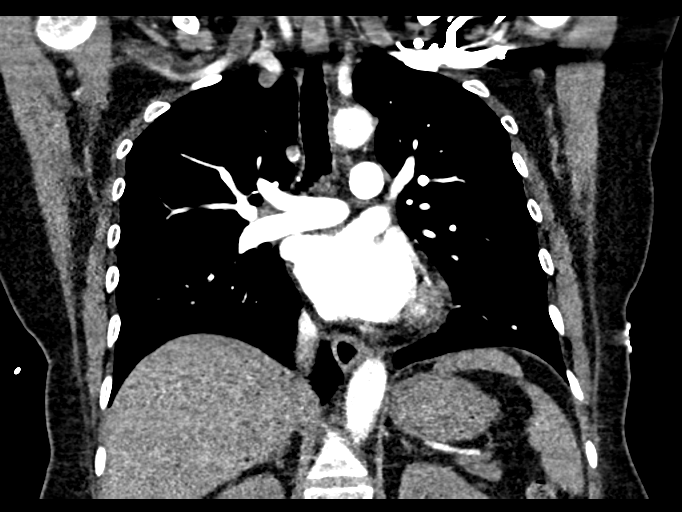

[19 of 36 positions shown; findings below may reference images not displayed]

FINDINGS: Cardiovascular: There are no filling defects within the pulmonary
arteries to suggest pulmonary embolus. Mild atherosclerosis of the
thoracic aorta. No dissection or acute aortic findings. No aneurysm.
Conventional branching pattern from the aortic arch. The heart is
normal in size there is no pericardial effusion.

Mediastinum/Nodes: No enlarged mediastinal or hilar lymph nodes. No
thyroid nodule. No esophageal wall thickening.

Lungs/Pleura: Minor central bronchial thickening. The lungs are
clear without focal airspace disease. No findings of pulmonary
edema. No pleural fluid. No suspicious nodule or mass.

Upper Abdomen: No acute or unexpected findings.

Musculoskeletal: There are no acute or suspicious osseous
abnormalities.

Review of the MIP images confirms the above findings.
IMPRESSION: 1. No pulmonary embolus.
2. Minor central bronchial thickening, can be seen with bronchitis
or reactive airways disease.

Aortic Atherosclerosis ([id number]-213.3).

## 2023-02-13 DIAGNOSIS — J329 Chronic sinusitis, unspecified: Secondary | ICD-10-CM | POA: Diagnosis not present

## 2023-02-13 DIAGNOSIS — Z6823 Body mass index (BMI) 23.0-23.9, adult: Secondary | ICD-10-CM | POA: Diagnosis not present

## 2023-02-13 DIAGNOSIS — Z20828 Contact with and (suspected) exposure to other viral communicable diseases: Secondary | ICD-10-CM | POA: Diagnosis not present

## 2023-02-13 DIAGNOSIS — R030 Elevated blood-pressure reading, without diagnosis of hypertension: Secondary | ICD-10-CM | POA: Diagnosis not present

## 2023-03-03 ENCOUNTER — Other Ambulatory Visit: Payer: Self-pay | Admitting: Obstetrics & Gynecology

## 2023-03-03 NOTE — Telephone Encounter (Signed)
Med refill request: Macrobid 100 mg cap for Prophylaxis Last AEX: 03/02/22 -ML Next AEX: Not scheduled  Last MMG (if hormonal med) -N/A   Refill authorized: Please Advise?

## 2023-03-10 ENCOUNTER — Other Ambulatory Visit (HOSPITAL_COMMUNITY): Payer: Self-pay | Admitting: Cardiology

## 2023-03-10 DIAGNOSIS — R079 Chest pain, unspecified: Secondary | ICD-10-CM

## 2023-03-22 ENCOUNTER — Ambulatory Visit (HOSPITAL_COMMUNITY)
Admission: RE | Admit: 2023-03-22 | Discharge: 2023-03-22 | Disposition: A | Discharge status: Home / Self Care | Payer: Medicare PPO | Source: Ambulatory Visit | Attending: Cardiology | Admitting: Cardiology

## 2023-03-22 DIAGNOSIS — R079 Chest pain, unspecified: Secondary | ICD-10-CM | POA: Insufficient documentation

## 2023-03-22 MED ORDER — TECHNETIUM TC 99M TETROFOSMIN IV KIT
10.2000 | PACK | Freq: Once | INTRAVENOUS | Status: AC | PRN
  Administered 2023-03-22: 10.2 via INTRAVENOUS

## 2023-03-22 MED ORDER — TECHNETIUM TC 99M TETROFOSMIN IV KIT
30.9000 | PACK | Freq: Once | INTRAVENOUS | Status: AC | PRN
  Administered 2023-03-22: 30.9 via INTRAVENOUS

## 2023-03-22 MED ORDER — REGADENOSON 0.4 MG/5ML IV SOLN
INTRAVENOUS | Status: AC
  Filled 2023-03-22: qty 5

## 2023-03-22 MED ORDER — REGADENOSON 0.4 MG/5ML IV SOLN
0.4000 mg | Freq: Once | INTRAVENOUS | Status: AC
  Administered 2023-03-22: 0.4 mg via INTRAVENOUS

## 2023-05-22 ENCOUNTER — Other Ambulatory Visit: Payer: Self-pay | Admitting: Obstetrics & Gynecology

## 2023-05-22 NOTE — Telephone Encounter (Signed)
Med refill request: Macrobid 100 mg cap for Prophylaxis  Last AEX: 03/02/22 Next AEX: not scheduled Last MMG (if hormonal med) 03/17/22 Refill authorized: Please Advise, #[phone], 0 RF

## 2023-05-22 NOTE — Telephone Encounter (Signed)
Needs annual visit for medication management. Thanks.

## 2023-05-26 DIAGNOSIS — K219 Gastro-esophageal reflux disease without esophagitis: Secondary | ICD-10-CM | POA: Diagnosis not present

## 2023-05-26 DIAGNOSIS — E559 Vitamin D deficiency, unspecified: Secondary | ICD-10-CM | POA: Diagnosis not present

## 2023-05-26 DIAGNOSIS — I10 Essential (primary) hypertension: Secondary | ICD-10-CM | POA: Diagnosis not present

## 2023-05-26 DIAGNOSIS — Z0001 Encounter for general adult medical examination with abnormal findings: Secondary | ICD-10-CM | POA: Diagnosis not present

## 2023-05-26 DIAGNOSIS — Z1329 Encounter for screening for other suspected endocrine disorder: Secondary | ICD-10-CM | POA: Diagnosis not present

## 2023-05-26 DIAGNOSIS — E039 Hypothyroidism, unspecified: Secondary | ICD-10-CM | POA: Diagnosis not present

## 2023-05-26 DIAGNOSIS — D519 Vitamin B12 deficiency anemia, unspecified: Secondary | ICD-10-CM | POA: Diagnosis not present

## 2023-05-26 DIAGNOSIS — Z135 Encounter for screening for eye and ear disorders: Secondary | ICD-10-CM | POA: Diagnosis not present

## 2023-05-26 DIAGNOSIS — D649 Anemia, unspecified: Secondary | ICD-10-CM | POA: Diagnosis not present

## 2023-05-26 DIAGNOSIS — Z131 Encounter for screening for diabetes mellitus: Secondary | ICD-10-CM | POA: Diagnosis not present

## 2023-05-26 DIAGNOSIS — E785 Hyperlipidemia, unspecified: Secondary | ICD-10-CM | POA: Diagnosis not present

## 2023-06-02 ENCOUNTER — Other Ambulatory Visit (HOSPITAL_COMMUNITY): Payer: Self-pay | Admitting: Family Medicine

## 2023-06-02 DIAGNOSIS — Z0000 Encounter for general adult medical examination without abnormal findings: Secondary | ICD-10-CM | POA: Diagnosis not present

## 2023-06-02 DIAGNOSIS — Z6824 Body mass index (BMI) 24.0-24.9, adult: Secondary | ICD-10-CM | POA: Diagnosis not present

## 2023-06-02 DIAGNOSIS — Z1231 Encounter for screening mammogram for malignant neoplasm of breast: Secondary | ICD-10-CM

## 2023-06-02 DIAGNOSIS — R0602 Shortness of breath: Secondary | ICD-10-CM | POA: Diagnosis not present

## 2023-06-02 DIAGNOSIS — I4819 Other persistent atrial fibrillation: Secondary | ICD-10-CM | POA: Diagnosis not present

## 2023-06-02 DIAGNOSIS — R0789 Other chest pain: Secondary | ICD-10-CM | POA: Diagnosis not present

## 2023-06-02 DIAGNOSIS — E559 Vitamin D deficiency, unspecified: Secondary | ICD-10-CM | POA: Diagnosis not present

## 2023-06-02 DIAGNOSIS — I10 Essential (primary) hypertension: Secondary | ICD-10-CM | POA: Diagnosis not present

## 2023-06-08 ENCOUNTER — Ambulatory Visit (HOSPITAL_COMMUNITY)
Admission: RE | Admit: 2023-06-08 | Discharge: 2023-06-08 | Disposition: A | Discharge status: Home / Self Care | Payer: Medicare PPO | Source: Ambulatory Visit | Attending: Family Medicine | Admitting: Family Medicine

## 2023-06-08 DIAGNOSIS — Z1231 Encounter for screening mammogram for malignant neoplasm of breast: Secondary | ICD-10-CM | POA: Insufficient documentation

## 2023-06-29 ENCOUNTER — Ambulatory Visit: Payer: Medicare PPO | Admitting: Adult Health

## 2023-06-29 ENCOUNTER — Encounter: Payer: Self-pay | Admitting: Adult Health

## 2023-06-29 ENCOUNTER — Ambulatory Visit (INDEPENDENT_AMBULATORY_CARE_PROVIDER_SITE_OTHER): Payer: Medicare PPO

## 2023-06-29 VITALS — BP 118/72 | HR 62 | Temp 98.6°F | Ht 60.0 in | Wt 129.4 lb

## 2023-06-29 DIAGNOSIS — R0602 Shortness of breath: Secondary | ICD-10-CM | POA: Diagnosis not present

## 2023-06-29 DIAGNOSIS — I4891 Unspecified atrial fibrillation: Secondary | ICD-10-CM

## 2023-06-29 DIAGNOSIS — R0683 Snoring: Secondary | ICD-10-CM

## 2023-06-29 NOTE — Assessment & Plan Note (Signed)
Snoring, daytime sleepiness, history of A-fib concerning for underlying sleep apnea.  Set up for home sleep study.  Patient education given on sleep apnea  - discussed how weight can impact sleep and risk for sleep disordered breathing - discussed options to assist with weight loss: combination of diet modification, cardiovascular and strength training exercises   - had an extensive discussion regarding the adverse health consequences related to untreated sleep disordered breathing - specifically discussed the risks for hypertension, coronary artery disease, cardiac dysrhythmias, cerebrovascular disease, and diabetes - lifestyle modification discussed   - discussed how sleep disruption can increase risk of accidents, particularly when driving - safe driving practices were discussed   Plan  Patient Instructions  Chest xray today .  Echo report from Cardiology.  Set up for home sleep study .  Follow up in 6 weeks with PFT and As needed

## 2023-06-29 NOTE — Progress Notes (Signed)
@Patient  ID: Yvonne [name], female    DOB: 04-02-1951, 72 y.o.   MRN: [id number]  Chief Complaint  Patient presents with   Follow-up    Referring provider: Dell Ponto  HPI: 72 year old female seen for pulmonary consult April 15, 2021 for shortness of breath  TEST/EVENTS :  CT chest angio March 26, 2021 negative for PE, central bronchial thickening, clear lungs  06/29/2023 Follow up : Dyspnea  Patient presents for a follow-up visit.  Last seen September 2022.  Patient was seen for pulmonary consult in July 2022 for shortness of breath.  Patient has been having episodes of intermittent shortness of breath that come and go with associated sob scapular pain along the left.  Patient had a CT chest angio March 26, 2021 that was negative for PE.  Showed clear lungs.  With some mild central bronchial thickening.  Patient has had a minimum smoking history quit over 50 years ago.  She was set up for PFTs but unfortunately these are still pending.  Patient says she has no significant cough or wheezing.  Patient says episodes seem to come and go got better for a while but lately over the last 6 months has been noticing she has intermittent episodes.  She denies any heartburn indigestion, hemoptysis, fever, abdominal pain, nausea vomiting diarrhea, palpitations or syncope.  Patient has been diagnosed in the last year with A-fib.  She is followed by cardiology with Dr. Sharyn Lull she is now on amiodarone and Eliquis.  Patient denies any known bleeding. He does have daytime sleepiness, snoring and restless sleep.  Does fall asleep easily if she is watching TV.  No known history of congestive heart failure or stroke.  Says that she has had a previous echo but results are not in EMR.  No known history of pulmonary hypertension.  No shortness of breath with activities.  O2 saturations today are 99% at rest on room air. No symptoms suspicious for cataplexy or sleep paralysis.  She does not take any sleep  aids.    Allergies  Allergen Reactions   Sulfa Antibiotics     Rash and itching and eyes were red   Tdap [Tetanus-Diphth-Acell Pertussis] Swelling    Immunization History  Administered Date(s) Administered   Influenza-Unspecified 06/26/2018, 06/19/2019, 06/09/2020   Tdap 04/19/2011, 04/19/2011    Past Medical History:  Diagnosis Date   Abnormal exercise tolerance test    Anemia    Chest pain    Diverticulosis    GERD (gastroesophageal reflux disease)    Hyperlipidemia     Tobacco History: Social History   Tobacco Use  Smoking Status Former   Current packs/day: 0.00   Average packs/day: 0.5 packs/day for 17.4 years (8.7 ttl pk-yrs)   Types: Cigarettes   Start date: 88   Quit date: 03/12/1983   Years since quitting: 40.3  Smokeless Tobacco Never   Counseling given: Not Answered   Outpatient Medications Prior to Visit  Medication Sig Dispense Refill   albuterol (VENTOLIN HFA) 108 (90 Base) MCG/ACT inhaler      amiodarone (PACERONE) 200 MG tablet Take 100 mg by mouth daily.     atorvastatin (LIPITOR) 20 MG tablet Take 20 mg by mouth daily.     docusate sodium (COLACE) 100 MG capsule Take 100 mg by mouth 2 (two) times daily.     ELIQUIS 5 MG TABS tablet Take 5 mg by mouth 2 (two) times daily.     losartan (COZAAR) 25 MG tablet Take 25  mg by mouth daily.     nitrofurantoin, macrocrystal-monohydrate, (MACROBID) 100 MG capsule TAKE 1 CAPSULE BY MOUTH DAILY FOR PROPHYLAXIS 30 capsule 0   nitroGLYCERIN (NITROSTAT) 0.4 MG SL tablet Place 0.4 mg under the tongue every 5 (five) minutes as needed.     OVER THE COUNTER MEDICATION Take 1 capsule by mouth daily. Ultra flora (probiotic)     polyethylene glycol powder (GLYCOLAX/MIRALAX) 17 GM/SCOOP powder Take 1 Container by mouth once.     aspirin EC 81 MG tablet Take 81 mg by mouth daily. Swallow whole. (Patient not taking: Reported on 06/29/2023)     cetirizine (ZYRTEC) 10 MG tablet Take 10 mg by mouth as needed. (Patient not  taking: Reported on 06/29/2023)     DEXILANT 60 MG capsule TAKE 1 CAPSULE BY MOUTH EVERY DAY (Patient not taking: Reported on 06/29/2023) 90 capsule 3   ferrous sulfate 325 (65 FE) MG EC tablet Take 1 tablet by mouth daily. (Patient not taking: Reported on 06/29/2023)     fluticasone (FLONASE) 50 MCG/ACT nasal spray Place 2 sprays into both nostrils as needed. (Patient not taking: Reported on 06/29/2023)     folic acid (FOLVITE) 1 MG tablet Take 1 mg by mouth every morning. (Patient not taking: Reported on 06/29/2023)     Pediatric Multivitamins-Iron (FLINTSTONES PLUS IRON) chewable tablet Chew 1 tablet by mouth 2 (two) times daily. (Patient not taking: Reported on 06/29/2023)     No facility-administered medications prior to visit.     Review of Systems:   Constitutional:   No  weight loss, night sweats,  Fevers, chills,+ fatigue, or  lassitude.  HEENT:   No headaches,  Difficulty swallowing,  Tooth/dental problems, or  Sore throat,                No sneezing, itching, ear ache, nasal congestion, post nasal drip,   CV:  No chest pain,  Orthopnea, PND, swelling in lower extremities, anasarca, dizziness, palpitations, syncope.   GI  No heartburn, indigestion, abdominal pain, nausea, vomiting, diarrhea, change in bowel habits, loss of appetite, bloody stools.   Resp: No shortness of breath with exertion or at rest.  No excess mucus, no productive cough,  No non-productive cough,  No coughing up of blood.  No change in color of mucus.  No wheezing.  No chest wall deformity  Skin: no rash or lesions.  GU: no dysuria, change in color of urine, no urgency or frequency.  No flank pain, no hematuria   MS:  No joint pain or swelling.  No decreased range of motion.  No back pain.    Physical Exam  BP 118/72 (BP Location: Left Arm, Patient Position: Sitting, Cuff Size: Normal)   Pulse 62   Temp 98.6 F (37 C) (Oral)   Ht 5' (1.524 m)   Wt 129 lb 6.4 oz (58.7 kg)   SpO2 99%   BMI 25.27 kg/m    GEN: A/Ox3; pleasant , NAD, well nourished    HEENT:  [place]/AT, NOSE-clear, THROAT-clear, no lesions, no postnasal drip or exudate noted.  Class 3 MP airway   NECK:  Supple w/ fair ROM; no JVD; normal carotid impulses w/o bruits; no thyromegaly or nodules palpated; no lymphadenopathy.    RESP  Clear  P & A; w/o, wheezes/ rales/ or rhonchi. no accessory muscle use, no dullness to percussion  CARD:  RRR, no m/r/g, no peripheral edema, pulses intact, no cyanosis or clubbing.  GI:   Soft & nt; nml  bowel sounds; no organomegaly or masses detected.   Musco: Warm bil, no deformities or joint swelling noted.   Neuro: alert, no focal deficits noted.    Skin: Warm, no lesions or rashes    Lab Results:  CBC     BNP No results found for: "BNP"  ProBNP No results found for: "PROBNP"  Administration History     None           No data to display          No results found for: "NITRICOXIDE"      Assessment & Plan:   Dyspnea Intermittent episodes of shortness of breath with associated left subscapular pain.  Questionable etiology.  Previous CT chest was unrevealing.  Will repeat chest x-ray today.  Check PFTs on return.  Question if this could be pleurisy.  Has no other associated symptoms.  Also she does have underlying A-fib.  Will need copy of echo if not recent could consider a repeat echo to look for other etiology and also to evaluate for pulmonary hypertension as she has risk factors for sleep apnea.  Plan  Patient Instructions  Chest xray today .  Echo report from Cardiology.  Set up for home sleep study .  Follow up in 6 weeks with PFT and As needed      Snoring Snoring, daytime sleepiness, history of A-fib concerning for underlying sleep apnea.  Set up for home sleep study.  Patient education given on sleep apnea  - discussed how weight can impact sleep and risk for sleep disordered breathing - discussed options to assist with weight loss: combination  of diet modification, cardiovascular and strength training exercises   - had an extensive discussion regarding the adverse health consequences related to untreated sleep disordered breathing - specifically discussed the risks for hypertension, coronary artery disease, cardiac dysrhythmias, cerebrovascular disease, and diabetes - lifestyle modification discussed   - discussed how sleep disruption can increase risk of accidents, particularly when driving - safe driving practices were discussed   Plan  Patient Instructions  Chest xray today .  Echo report from Cardiology.  Set up for home sleep study .  Follow up in 6 weeks with PFT and As needed        Rubye Oaks, NP 06/29/2023

## 2023-06-29 NOTE — Assessment & Plan Note (Signed)
Intermittent episodes of shortness of breath with associated left subscapular pain.  Questionable etiology.  Previous CT chest was unrevealing.  Will repeat chest x-ray today.  Check PFTs on return.  Question if this could be pleurisy.  Has no other associated symptoms.  Also she does have underlying A-fib.  Will need copy of echo if not recent could consider a repeat echo to look for other etiology and also to evaluate for pulmonary hypertension as she has risk factors for sleep apnea.  Plan  Patient Instructions  Chest xray today .  Echo report from Cardiology.  Set up for home sleep study .  Follow up in 6 weeks with PFT and As needed

## 2023-06-29 NOTE — Patient Instructions (Addendum)
Chest xray today .  Echo report from Cardiology.  Set up for home sleep study .  Follow up in 6 weeks with PFT and As needed

## 2023-07-04 ENCOUNTER — Telehealth: Payer: Self-pay | Admitting: Adult Health

## 2023-07-04 NOTE — Telephone Encounter (Signed)
Patient is wondering if her insurance will cover her home sleep test. Please call and advise [phone]

## 2023-07-04 NOTE — Telephone Encounter (Signed)
Order was sent to [employer] they verify the insurance for the patient then reach out to the patient   Patient aware

## 2023-07-23 ENCOUNTER — Encounter (INDEPENDENT_AMBULATORY_CARE_PROVIDER_SITE_OTHER): Payer: Medicare PPO

## 2023-07-23 DIAGNOSIS — G4730 Sleep apnea, unspecified: Secondary | ICD-10-CM | POA: Diagnosis not present

## 2023-07-23 DIAGNOSIS — I4891 Unspecified atrial fibrillation: Secondary | ICD-10-CM

## 2023-07-23 DIAGNOSIS — R0683 Snoring: Secondary | ICD-10-CM

## 2023-08-11 ENCOUNTER — Other Ambulatory Visit: Payer: Self-pay | Admitting: *Deleted

## 2023-08-11 DIAGNOSIS — R0602 Shortness of breath: Secondary | ICD-10-CM

## 2023-08-14 ENCOUNTER — Ambulatory Visit: Payer: Medicare PPO | Admitting: Pulmonary Disease

## 2023-08-14 DIAGNOSIS — R0602 Shortness of breath: Secondary | ICD-10-CM

## 2023-08-14 LAB — PULMONARY FUNCTION TEST
DL/VA % pred: 93 %
DL/VA: 3.96 ml/min/mmHg/L
DLCO cor % pred: 94 %
DLCO cor: 15.81 ml/min/mmHg
DLCO unc % pred: 94 %
DLCO unc: 15.81 ml/min/mmHg
FEF 25-75 Post: 1.6 L/s
FEF 25-75 Pre: 1.55 L/s
FEF2575-%Change-Post: 2 %
FEF2575-%Pred-Post: 100 %
FEF2575-%Pred-Pre: 98 %
FEV1-%Change-Post: 3 %
FEV1-%Pred-Post: 106 %
FEV1-%Pred-Pre: 102 %
FEV1-Post: 1.95 L
FEV1-Pre: 1.88 L
FEV1FVC-%Change-Post: 2 %
FEV1FVC-%Pred-Pre: 98 %
FEV6-%Change-Post: 1 %
FEV6-%Pred-Post: 110 %
FEV6-%Pred-Pre: 108 %
FEV6-Post: 2.56 L
FEV6-Pre: 2.54 L
FEV6FVC-%Pred-Post: 105 %
FEV6FVC-%Pred-Pre: 105 %
FVC-%Change-Post: 1 %
FVC-%Pred-Post: 104 %
FVC-%Pred-Pre: 103 %
FVC-Post: 2.56 L
FVC-Pre: 2.54 L
Post FEV1/FVC ratio: 76 %
Post FEV6/FVC ratio: 100 %
Pre FEV1/FVC ratio: 74 %
Pre FEV6/FVC Ratio: 100 %
RV % pred: 296 %
RV: 6.01 L
TLC % pred: 194 %
TLC: 8.67 L

## 2023-08-14 NOTE — Patient Instructions (Signed)
Full PFT performed today. °

## 2023-08-14 NOTE — Progress Notes (Signed)
Full PFT performed today. °

## 2023-08-15 DIAGNOSIS — G4733 Obstructive sleep apnea (adult) (pediatric): Secondary | ICD-10-CM | POA: Diagnosis not present

## 2023-08-18 ENCOUNTER — Telehealth: Payer: Self-pay

## 2023-08-18 NOTE — Telephone Encounter (Signed)
Noted! Thank you

## 2023-08-18 NOTE — Telephone Encounter (Addendum)
Spoke to patient. She stated that she had HST, however results are not available in epic-- PCC's, can you guys help with this?

## 2023-08-21 ENCOUNTER — Ambulatory Visit: Payer: Medicare PPO | Admitting: Adult Health

## 2023-08-21 ENCOUNTER — Encounter: Payer: Self-pay | Admitting: Adult Health

## 2023-08-21 VITALS — BP 100/60 | HR 58 | Ht 60.0 in | Wt 129.0 lb

## 2023-08-21 DIAGNOSIS — R0602 Shortness of breath: Secondary | ICD-10-CM

## 2023-08-21 DIAGNOSIS — G4733 Obstructive sleep apnea (adult) (pediatric): Secondary | ICD-10-CM

## 2023-08-21 DIAGNOSIS — I4891 Unspecified atrial fibrillation: Secondary | ICD-10-CM | POA: Diagnosis not present

## 2023-08-21 NOTE — Progress Notes (Signed)
@Patient  ID: Yvonne [name], female    DOB: 07-16-1951, 72 y.o.   MRN: [id number]  Chief Complaint  Patient presents with   Follow-up    Referring provider: Dell Ponto  HPI: 72 year old female former smoker-minimum smoking history seen for pulmonary consult April 15, 2021 for shortness of breath.  Reestablished June 29, 2023 for shortness of breath and daytime sleepiness Medical history significant for A-fib  TEST/EVENTS :   08/21/2023 Follow up : Dyspnea and OSA  Patient presents for a 6-week follow-up.  Patient was seen last visit to reestablish in our pulmonary clinic.  Had previously been seen September 2022 for shortness of breath.  Patient says over the last couple years she has had some intermittent episodes of shortness of breath.  CT chest angio June 2022 was negative for PE.  Showed clear lungs.  She has a minimum smoking history quit over 50 years ago.  Patient was set up for PFTs that showed normal lung function.  PFTs done today showed FEV1 at 106%.  Ratio 76.  FVC 104%.  DLCO 94%.  No significant bronchodilator response.  Patient says that she really does not have much shortness of breath.  Occasionally will experience a little dyspnea.  She does follow with cardiology on a routine basis.  She has A-fib and is on amiodarone and Eliquis.  She says she has had recent labs for her physical with her primary care provider and says she was normal.  These records are unavailable at today's visit.  Last visit patient did have some daytime sleepiness snoring and restless sleep.  She was felt to be at risk for possible sleep apnea.  She was set up for home sleep study that was done July 23, 2023 that showed moderate obstructive sleep apnea with AHI at 20.3/hour.  SpO2 low at 82%.  We discussed her sleep study results in detail.  Went over treatment options including weight loss, oral appliance and CPAP therapy.  Patient says she will try to work on weight loss.  She declines  referral for oral appliance.  She says she does not want to begin on CPAP.  We discussed healthy sleep regimen.  Advised to avoid sleeping on her back into sleep with her head of the bed at an incline.  We did go over potential cardiovascular and cerebrovascular complications of untreated sleep apnea.  Allergies  Allergen Reactions   Sulfa Antibiotics     Rash and itching and eyes were red   Tdap [Tetanus-Diphth-Acell Pertussis] Swelling    Immunization History  Administered Date(s) Administered   Influenza-Unspecified 06/26/2018, 06/19/2019, 06/09/2020   Tdap 04/19/2011, 04/19/2011    Past Medical History:  Diagnosis Date   Abnormal exercise tolerance test    Anemia    Chest pain    Diverticulosis    GERD (gastroesophageal reflux disease)    Hyperlipidemia     Tobacco History: Social History   Tobacco Use  Smoking Status Former   Current packs/day: 0.00   Average packs/day: 0.5 packs/day for 17.4 years (8.7 ttl pk-yrs)   Types: Cigarettes   Start date: 36   Quit date: 03/12/1983   Years since quitting: 40.4  Smokeless Tobacco Never   Counseling given: Not Answered   Outpatient Medications Prior to Visit  Medication Sig Dispense Refill   albuterol (VENTOLIN HFA) 108 (90 Base) MCG/ACT inhaler      amiodarone (PACERONE) 200 MG tablet Take 100 mg by mouth daily. Pt taking every other day  atorvastatin (LIPITOR) 20 MG tablet Take 20 mg by mouth daily.     docusate sodium (COLACE) 100 MG capsule Take 100 mg by mouth 2 (two) times daily.     ELIQUIS 5 MG TABS tablet Take 5 mg by mouth 2 (two) times daily.     losartan (COZAAR) 25 MG tablet Take 25 mg by mouth daily.     nitrofurantoin, macrocrystal-monohydrate, (MACROBID) 100 MG capsule TAKE 1 CAPSULE BY MOUTH DAILY FOR PROPHYLAXIS 30 capsule 0   nitroGLYCERIN (NITROSTAT) 0.4 MG SL tablet Place 0.4 mg under the tongue every 5 (five) minutes as needed.     OVER THE COUNTER MEDICATION Take 1 capsule by mouth daily. Ultra  flora (probiotic)     polyethylene glycol powder (GLYCOLAX/MIRALAX) 17 GM/SCOOP powder Take 1 Container by mouth once.     No facility-administered medications prior to visit.     Review of Systems:   Constitutional:   No  weight loss, night sweats,  Fevers, chills, fatigue, or  lassitude.  HEENT:   No headaches,  Difficulty swallowing,  Tooth/dental problems, or  Sore throat,                No sneezing, itching, ear ache, nasal congestion, post nasal drip,   CV:  No chest pain,  Orthopnea, PND, swelling in lower extremities, anasarca, dizziness, palpitations, syncope.   GI  No heartburn, indigestion, abdominal pain, nausea, vomiting, diarrhea, change in bowel habits, loss of appetite, bloody stools.   Resp: No shortness of breath with exertion or at rest.  No excess mucus, no productive cough,  No non-productive cough,  No coughing up of blood.  No change in color of mucus.  No wheezing.  No chest wall deformity  Skin: no rash or lesions.  GU: no dysuria, change in color of urine, no urgency or frequency.  No flank pain, no hematuria   MS:  No joint pain or swelling.  No decreased range of motion.  No back pain.    Physical Exam  BP 100/60 (BP Location: Left Arm, Cuff Size: Normal)   Pulse (!) 58   Ht 5' (1.524 m)   Wt 129 lb (58.5 kg)   SpO2 99%   BMI 25.19 kg/m   GEN: A/Ox3; pleasant , NAD, well nourished    HEENT:  [place]/AT,  EACs-clear, TMs-wnl, NOSE-clear, THROAT-clear, no lesions, no postnasal drip or exudate noted.  Class III MP airway NECK:  Supple w/ fair ROM; no JVD; normal carotid impulses w/o bruits; no thyromegaly or nodules palpated; no lymphadenopathy.    RESP  Clear  P & A; w/o, wheezes/ rales/ or rhonchi. no accessory muscle use, no dullness to percussion  CARD:  RRR, no m/r/g, no peripheral edema, pulses intact, no cyanosis or clubbing.  GI:   Soft & nt; nml bowel sounds; no organomegaly or masses detected.   Musco: Warm bil, no deformities or joint  swelling noted.   Neuro: alert, no focal deficits noted.    Skin: Warm, no lesions or rashes    Lab Results:      BNP No results found for: "BNP"  ProBNP No results found for: "PROBNP"  Imaging: No results found.  Administration History     None          Latest Ref Rng & Units 08/14/2023    9:36 AM  PFT Results  FVC-Pre L 2.54  P  FVC-Predicted Pre % 103  P  FVC-Post L 2.56  P  FVC-Predicted Post %  104  P  Pre FEV1/FVC % % 74  P  Post FEV1/FCV % % 76  P  FEV1-Pre L 1.88  P  FEV1-Predicted Pre % 102  P  FEV1-Post L 1.95  P  DLCO uncorrected ml/min/mmHg 15.81  P  DLCO UNC% % 94  P  DLCO corrected ml/min/mmHg 15.81  P  DLCO COR %Predicted % 94  P  DLVA Predicted % 93  P  TLC L 8.67  P  TLC % Predicted % 194  P  RV % Predicted % 296  P    P Preliminary result    No results found for: "NITRICOXIDE"      Assessment & Plan:  Assessment and Plan    Obstructive Sleep Apnea Diagnosed with moderate obstructive sleep apnea via a sleep study on July 23, 2023,  AHI 20.3/hr , SPO2 low 82% Despite being asymptomatic regarding daytime sleepiness, they are at risk for cerebrovascular and cardiovascular complications  We discussed CPAP therapy, a dental device, and surgical intervention (Inspire device), noting the surgical option requires a prior CPAP trial. They expressed reluctance towards CPAP and the dental device. Advised to discuss with Dr. Sharyn Lull the impact of sleep apnea on atrial fibrillation.  Healthy sleep regimen discussed.  Advised to sleep with head of the bed elevated at 30 degrees.  Avoid sleeping on her back.  Follow back up in her office if she changes her mind on CPAP or referral for dental appliance for sleep apnea  Atrial Fibrillation  chronic atrial fibrillation is managed by Dr. Sharyn Lull with amiodarone and Eliquis, and they report no recent symptoms of low energy or fatigue. We emphasized the importance of managing sleep apnea to reduce  cardiac stress and will discuss with Dr. Sharyn Lull the potential impact of sleep apnea on atrial fibrillation.   Intermittent Dyspnea Report episodes of mild intermittent shortness of breath,  Work and has been unrevealing.  Chest x-ray showed clear lungs.  Pulmonary function testing was reviewed in detail today.  And is normal.  Symptoms seem to be very mild.  Advised to discuss further in detail with cardiology.  Keep follow-up with primary care for ongoing health maintenance and lab work.    General Health Maintenance Encouraging maintaining a healthy weight and discussing the benefits of weight loss on overall health.  Follow-up We will follow up with Dr. Sharyn Lull regarding cardiac evaluation and management and have advised them to contact us if they decide to pursue CPAP or other sleep apnea treatments.      Plan  Patient Instructions  Call back if you change your mind on CPAP.  Avoid sleep on your back  Sleep with head of bed at 30 degree incline .  Activity as tolerated Follow up with Cardiology as planned. Follow up with our office as needed.        Rubye Oaks, NP 08/21/2023

## 2023-08-21 NOTE — Patient Instructions (Addendum)
Call back if you change your mind on CPAP.  Avoid sleep on your back  Sleep with head of bed at 30 degree incline .  Activity as tolerated Follow up with Cardiology as planned. Follow up with our office as needed.

## 2023-09-07 DIAGNOSIS — H524 Presbyopia: Secondary | ICD-10-CM | POA: Diagnosis not present

## 2023-09-07 DIAGNOSIS — H40012 Open angle with borderline findings, low risk, left eye: Secondary | ICD-10-CM | POA: Diagnosis not present

## 2023-09-07 DIAGNOSIS — H2513 Age-related nuclear cataract, bilateral: Secondary | ICD-10-CM | POA: Diagnosis not present

## 2023-09-07 DIAGNOSIS — H11002 Unspecified pterygium of left eye: Secondary | ICD-10-CM | POA: Diagnosis not present

## 2023-10-17 DIAGNOSIS — K625 Hemorrhage of anus and rectum: Secondary | ICD-10-CM | POA: Diagnosis not present

## 2023-10-17 DIAGNOSIS — K600 Acute anal fissure: Secondary | ICD-10-CM | POA: Diagnosis not present

## 2023-10-17 DIAGNOSIS — K5904 Chronic idiopathic constipation: Secondary | ICD-10-CM | POA: Diagnosis not present

## 2023-10-17 DIAGNOSIS — K648 Other hemorrhoids: Secondary | ICD-10-CM | POA: Diagnosis not present

## 2023-10-23 DIAGNOSIS — K648 Other hemorrhoids: Secondary | ICD-10-CM | POA: Diagnosis not present

## 2023-10-23 DIAGNOSIS — K625 Hemorrhage of anus and rectum: Secondary | ICD-10-CM | POA: Diagnosis not present

## 2023-10-23 DIAGNOSIS — K5904 Chronic idiopathic constipation: Secondary | ICD-10-CM | POA: Diagnosis not present

## 2023-10-23 DIAGNOSIS — K600 Acute anal fissure: Secondary | ICD-10-CM | POA: Diagnosis not present

## 2023-10-27 DIAGNOSIS — Z6824 Body mass index (BMI) 24.0-24.9, adult: Secondary | ICD-10-CM | POA: Diagnosis not present

## 2023-10-27 DIAGNOSIS — E038 Other specified hypothyroidism: Secondary | ICD-10-CM | POA: Diagnosis not present

## 2023-10-27 DIAGNOSIS — D649 Anemia, unspecified: Secondary | ICD-10-CM | POA: Diagnosis not present

## 2023-10-30 ENCOUNTER — Other Ambulatory Visit (HOSPITAL_COMMUNITY): Payer: Self-pay | Admitting: Family Medicine

## 2023-10-30 DIAGNOSIS — M810 Age-related osteoporosis without current pathological fracture: Secondary | ICD-10-CM

## 2023-11-14 ENCOUNTER — Ambulatory Visit (HOSPITAL_COMMUNITY)
Admission: RE | Admit: 2023-11-14 | Discharge: 2023-11-14 | Disposition: A | Discharge status: Home / Self Care | Payer: Medicare PPO | Source: Ambulatory Visit | Attending: Family Medicine | Admitting: Family Medicine

## 2023-11-14 DIAGNOSIS — M810 Age-related osteoporosis without current pathological fracture: Secondary | ICD-10-CM | POA: Insufficient documentation

## 2023-11-14 DIAGNOSIS — Z780 Asymptomatic menopausal state: Secondary | ICD-10-CM | POA: Diagnosis not present

## 2023-11-17 DIAGNOSIS — E782 Mixed hyperlipidemia: Secondary | ICD-10-CM | POA: Diagnosis not present

## 2023-11-17 DIAGNOSIS — I10 Essential (primary) hypertension: Secondary | ICD-10-CM | POA: Diagnosis not present

## 2023-11-17 DIAGNOSIS — Z8679 Personal history of other diseases of the circulatory system: Secondary | ICD-10-CM | POA: Diagnosis not present

## 2023-11-20 ENCOUNTER — Encounter (INDEPENDENT_AMBULATORY_CARE_PROVIDER_SITE_OTHER): Payer: Self-pay | Admitting: *Deleted

## 2023-12-08 DIAGNOSIS — E038 Other specified hypothyroidism: Secondary | ICD-10-CM | POA: Diagnosis not present

## 2023-12-08 DIAGNOSIS — D519 Vitamin B12 deficiency anemia, unspecified: Secondary | ICD-10-CM | POA: Diagnosis not present

## 2023-12-08 DIAGNOSIS — D529 Folate deficiency anemia, unspecified: Secondary | ICD-10-CM | POA: Diagnosis not present

## 2023-12-08 DIAGNOSIS — D649 Anemia, unspecified: Secondary | ICD-10-CM | POA: Diagnosis not present

## 2023-12-25 ENCOUNTER — Ambulatory Visit (INDEPENDENT_AMBULATORY_CARE_PROVIDER_SITE_OTHER): Payer: Medicare PPO | Admitting: Gastroenterology

## 2024-01-01 ENCOUNTER — Ambulatory Visit (INDEPENDENT_AMBULATORY_CARE_PROVIDER_SITE_OTHER): Payer: Medicare PPO | Admitting: Gastroenterology

## 2024-01-04 DIAGNOSIS — Z6824 Body mass index (BMI) 24.0-24.9, adult: Secondary | ICD-10-CM | POA: Diagnosis not present

## 2024-01-04 DIAGNOSIS — R300 Dysuria: Secondary | ICD-10-CM | POA: Diagnosis not present

## 2024-02-29 DIAGNOSIS — D649 Anemia, unspecified: Secondary | ICD-10-CM | POA: Diagnosis not present

## 2024-02-29 DIAGNOSIS — D519 Vitamin B12 deficiency anemia, unspecified: Secondary | ICD-10-CM | POA: Diagnosis not present

## 2024-02-29 DIAGNOSIS — E559 Vitamin D deficiency, unspecified: Secondary | ICD-10-CM | POA: Diagnosis not present

## 2024-02-29 DIAGNOSIS — D529 Folate deficiency anemia, unspecified: Secondary | ICD-10-CM | POA: Diagnosis not present

## 2024-02-29 DIAGNOSIS — E038 Other specified hypothyroidism: Secondary | ICD-10-CM | POA: Diagnosis not present

## 2024-03-07 DIAGNOSIS — R0781 Pleurodynia: Secondary | ICD-10-CM | POA: Diagnosis not present

## 2024-03-07 DIAGNOSIS — D649 Anemia, unspecified: Secondary | ICD-10-CM | POA: Diagnosis not present

## 2024-03-07 DIAGNOSIS — R300 Dysuria: Secondary | ICD-10-CM | POA: Diagnosis not present

## 2024-03-07 DIAGNOSIS — Z0001 Encounter for general adult medical examination with abnormal findings: Secondary | ICD-10-CM | POA: Diagnosis not present

## 2024-03-07 DIAGNOSIS — Z1331 Encounter for screening for depression: Secondary | ICD-10-CM | POA: Diagnosis not present

## 2024-03-07 DIAGNOSIS — I4819 Other persistent atrial fibrillation: Secondary | ICD-10-CM | POA: Diagnosis not present

## 2024-03-07 DIAGNOSIS — Z6824 Body mass index (BMI) 24.0-24.9, adult: Secondary | ICD-10-CM | POA: Diagnosis not present

## 2024-03-07 DIAGNOSIS — Z1389 Encounter for screening for other disorder: Secondary | ICD-10-CM | POA: Diagnosis not present

## 2024-03-07 DIAGNOSIS — Z0000 Encounter for general adult medical examination without abnormal findings: Secondary | ICD-10-CM | POA: Diagnosis not present

## 2024-03-08 DIAGNOSIS — I10 Essential (primary) hypertension: Secondary | ICD-10-CM | POA: Diagnosis not present

## 2024-03-08 DIAGNOSIS — E782 Mixed hyperlipidemia: Secondary | ICD-10-CM | POA: Diagnosis not present

## 2024-03-08 DIAGNOSIS — Z8679 Personal history of other diseases of the circulatory system: Secondary | ICD-10-CM | POA: Diagnosis not present

## 2024-05-14 DIAGNOSIS — R42 Dizziness and giddiness: Secondary | ICD-10-CM | POA: Diagnosis not present

## 2024-05-14 DIAGNOSIS — E782 Mixed hyperlipidemia: Secondary | ICD-10-CM | POA: Diagnosis not present

## 2024-05-14 DIAGNOSIS — Z8679 Personal history of other diseases of the circulatory system: Secondary | ICD-10-CM | POA: Diagnosis not present

## 2024-05-14 DIAGNOSIS — I10 Essential (primary) hypertension: Secondary | ICD-10-CM | POA: Diagnosis not present

## 2024-06-05 DIAGNOSIS — D529 Folate deficiency anemia, unspecified: Secondary | ICD-10-CM | POA: Diagnosis not present

## 2024-06-05 DIAGNOSIS — Z1329 Encounter for screening for other suspected endocrine disorder: Secondary | ICD-10-CM | POA: Diagnosis not present

## 2024-06-05 DIAGNOSIS — D649 Anemia, unspecified: Secondary | ICD-10-CM | POA: Diagnosis not present

## 2024-06-05 DIAGNOSIS — R5383 Other fatigue: Secondary | ICD-10-CM | POA: Diagnosis not present

## 2024-06-05 DIAGNOSIS — D519 Vitamin B12 deficiency anemia, unspecified: Secondary | ICD-10-CM | POA: Diagnosis not present

## 2024-06-14 DIAGNOSIS — I4819 Other persistent atrial fibrillation: Secondary | ICD-10-CM | POA: Diagnosis not present

## 2024-06-14 DIAGNOSIS — E038 Other specified hypothyroidism: Secondary | ICD-10-CM | POA: Diagnosis not present

## 2024-06-14 DIAGNOSIS — R0781 Pleurodynia: Secondary | ICD-10-CM | POA: Diagnosis not present

## 2024-06-14 DIAGNOSIS — D649 Anemia, unspecified: Secondary | ICD-10-CM | POA: Diagnosis not present

## 2024-06-14 DIAGNOSIS — Z6824 Body mass index (BMI) 24.0-24.9, adult: Secondary | ICD-10-CM | POA: Diagnosis not present

## 2024-07-04 DIAGNOSIS — Z131 Encounter for screening for diabetes mellitus: Secondary | ICD-10-CM | POA: Diagnosis not present

## 2024-07-04 DIAGNOSIS — I10 Essential (primary) hypertension: Secondary | ICD-10-CM | POA: Diagnosis not present

## 2024-07-04 DIAGNOSIS — D649 Anemia, unspecified: Secondary | ICD-10-CM | POA: Diagnosis not present

## 2024-07-04 DIAGNOSIS — Z13220 Encounter for screening for lipoid disorders: Secondary | ICD-10-CM | POA: Diagnosis not present

## 2024-07-04 DIAGNOSIS — Z0001 Encounter for general adult medical examination with abnormal findings: Secondary | ICD-10-CM | POA: Diagnosis not present

## 2024-07-17 ENCOUNTER — Encounter (INDEPENDENT_AMBULATORY_CARE_PROVIDER_SITE_OTHER): Payer: Self-pay | Admitting: Gastroenterology

## 2024-10-23 ENCOUNTER — Other Ambulatory Visit (HOSPITAL_COMMUNITY): Payer: Self-pay | Admitting: Family Medicine

## 2024-10-23 DIAGNOSIS — Z1231 Encounter for screening mammogram for malignant neoplasm of breast: Secondary | ICD-10-CM

## 2024-10-29 ENCOUNTER — Other Ambulatory Visit (HOSPITAL_COMMUNITY): Payer: Self-pay | Admitting: Cardiology

## 2024-10-29 ENCOUNTER — Encounter: Payer: Self-pay | Admitting: Cardiology

## 2024-10-29 DIAGNOSIS — R079 Chest pain, unspecified: Secondary | ICD-10-CM

## 2024-11-04 ENCOUNTER — Ambulatory Visit (HOSPITAL_COMMUNITY): Admission: RE | Admit: 2024-11-04

## 2024-11-06 ENCOUNTER — Ambulatory Visit (HOSPITAL_COMMUNITY)
Admission: RE | Admit: 2024-11-06 | Discharge: 2024-11-06 | Disposition: A | Source: Ambulatory Visit | Attending: Family Medicine | Admitting: Family Medicine

## 2024-11-06 DIAGNOSIS — Z1231 Encounter for screening mammogram for malignant neoplasm of breast: Secondary | ICD-10-CM

## 2024-11-11 ENCOUNTER — Other Ambulatory Visit (HOSPITAL_COMMUNITY)
